# Patient Record
Sex: Female | Born: 1935 | Race: White | Hispanic: No | Smoking: Never smoker
Health system: Southern US, Community
[De-identification: ages and names within clinical notes are randomized; demographics above are authoritative.]

## PROBLEM LIST (undated history)

## (undated) DIAGNOSIS — C801 Malignant (primary) neoplasm, unspecified: Secondary | ICD-10-CM

## (undated) DIAGNOSIS — F329 Major depressive disorder, single episode, unspecified: Secondary | ICD-10-CM

## (undated) DIAGNOSIS — F0390 Unspecified dementia without behavioral disturbance: Secondary | ICD-10-CM

## (undated) DIAGNOSIS — F32A Depression, unspecified: Secondary | ICD-10-CM

## (undated) DIAGNOSIS — E119 Type 2 diabetes mellitus without complications: Secondary | ICD-10-CM

## (undated) DIAGNOSIS — F410 Panic disorder [episodic paroxysmal anxiety] without agoraphobia: Secondary | ICD-10-CM

## (undated) DIAGNOSIS — N289 Disorder of kidney and ureter, unspecified: Secondary | ICD-10-CM

## (undated) DIAGNOSIS — I10 Essential (primary) hypertension: Secondary | ICD-10-CM

## (undated) DIAGNOSIS — I4891 Unspecified atrial fibrillation: Secondary | ICD-10-CM

## (undated) HISTORY — PX: COLON RESECTION: SHX5231

---

## 2011-07-04 ENCOUNTER — Emergency Department: Payer: Self-pay | Admitting: Emergency Medicine

## 2011-07-04 LAB — CBC WITH DIFFERENTIAL/PLATELET
Basophil #: 0 10*3/uL (ref 0.0–0.1)
Basophil %: 0.6 %
Eosinophil #: 0.2 10*3/uL (ref 0.0–0.7)
HCT: 32.3 % — ABNORMAL LOW (ref 35.0–47.0)
HGB: 10.6 g/dL — ABNORMAL LOW (ref 12.0–16.0)
Lymphocyte #: 0.9 10*3/uL — ABNORMAL LOW (ref 1.0–3.6)
Lymphocyte %: 12.4 %
MCH: 27.2 pg (ref 26.0–34.0)
MCHC: 32.8 g/dL (ref 32.0–36.0)
MCV: 83 fL (ref 80–100)
Neutrophil #: 5.8 10*3/uL (ref 1.4–6.5)
Neutrophil %: 77.4 %
Platelet: 231 10*3/uL (ref 150–440)
RBC: 3.89 10*6/uL (ref 3.80–5.20)
RDW: 15.5 % — ABNORMAL HIGH (ref 11.5–14.5)

## 2011-07-04 LAB — URINALYSIS, COMPLETE
Bilirubin,UR: NEGATIVE
Blood: NEGATIVE
Glucose,UR: NEGATIVE mg/dL (ref 0–75)
Ketone: NEGATIVE
Nitrite: NEGATIVE
Ph: 7 (ref 4.5–8.0)
Protein: NEGATIVE
RBC,UR: NONE SEEN /HPF (ref 0–5)
Renal Epithelial: <1
Specific Gravity: 1.006 (ref 1.003–1.030)
Squamous Epithelial: 1
Transitional Epi: 1
WBC UR: 5 /HPF (ref 0–5)

## 2011-07-04 LAB — PROTIME-INR
INR: 0.9
Prothrombin Time: 12.2 secs (ref 11.5–14.7)

## 2011-07-04 LAB — BASIC METABOLIC PANEL
Anion Gap: 10 (ref 7–16)
Calcium, Total: 9.2 mg/dL (ref 8.5–10.1)
Co2: 29 mmol/L (ref 21–32)
EGFR (Non-African Amer.): 48 — ABNORMAL LOW
Sodium: 139 mmol/L (ref 136–145)

## 2011-09-11 ENCOUNTER — Emergency Department: Payer: Self-pay | Admitting: Emergency Medicine

## 2011-09-11 LAB — CBC
HCT: 32.6 % — ABNORMAL LOW (ref 35.0–47.0)
HGB: 11 g/dL — ABNORMAL LOW (ref 12.0–16.0)
RBC: 4.04 10*6/uL (ref 3.80–5.20)
RDW: 15.7 % — ABNORMAL HIGH (ref 11.5–14.5)
WBC: 7.9 10*3/uL (ref 3.6–11.0)

## 2011-09-11 LAB — URINALYSIS, COMPLETE
Blood: NEGATIVE
Glucose,UR: NEGATIVE mg/dL (ref 0–75)
Hyaline Cast: 1
Nitrite: POSITIVE
Ph: 7 (ref 4.5–8.0)
Protein: NEGATIVE
Specific Gravity: 1.01 (ref 1.003–1.030)
Squamous Epithelial: <1
WBC UR: 1 /HPF (ref 0–5)

## 2011-09-11 LAB — COMPREHENSIVE METABOLIC PANEL
Albumin: 3.4 g/dL (ref 3.4–5.0)
Alkaline Phosphatase: 94 U/L (ref 50–136)
BUN: 21 mg/dL — ABNORMAL HIGH (ref 7–18)
Bilirubin,Total: 0.5 mg/dL (ref 0.2–1.0)
Calcium, Total: 9.4 mg/dL (ref 8.5–10.1)
Creatinine: 1.09 mg/dL (ref 0.60–1.30)
EGFR (African American): 53 — ABNORMAL LOW
Glucose: 141 mg/dL — ABNORMAL HIGH (ref 65–99)
Potassium: 3.6 mmol/L (ref 3.5–5.1)
SGOT(AST): 32 U/L (ref 15–37)
SGPT (ALT): 21 U/L (ref 12–78)
Sodium: 132 mmol/L — ABNORMAL LOW (ref 136–145)
Total Protein: 7.7 g/dL (ref 6.4–8.2)

## 2012-05-04 ENCOUNTER — Emergency Department: Payer: Self-pay | Admitting: Emergency Medicine

## 2012-05-04 LAB — URINALYSIS, COMPLETE
Bilirubin,UR: NEGATIVE
Blood: NEGATIVE
Glucose,UR: 50 mg/dL (ref 0–75)
Ketone: NEGATIVE
Ph: 7 (ref 4.5–8.0)
Protein: NEGATIVE
RBC,UR: 1 /HPF (ref 0–5)
Specific Gravity: 1.015 (ref 1.003–1.030)
WBC UR: 3 /HPF (ref 0–5)

## 2012-05-04 LAB — CBC
HGB: 10.4 g/dL — ABNORMAL LOW (ref 12.0–16.0)
MCH: 25.6 pg — ABNORMAL LOW (ref 26.0–34.0)
MCHC: 32.4 g/dL (ref 32.0–36.0)
MCV: 79 fL — ABNORMAL LOW (ref 80–100)
Platelet: 246 10*3/uL (ref 150–440)
RBC: 4.05 10*6/uL (ref 3.80–5.20)
RDW: 18.1 % — ABNORMAL HIGH (ref 11.5–14.5)
WBC: 11.1 10*3/uL — ABNORMAL HIGH (ref 3.6–11.0)

## 2012-05-04 LAB — COMPREHENSIVE METABOLIC PANEL
Alkaline Phosphatase: 94 U/L (ref 50–136)
Anion Gap: 8 (ref 7–16)
Bilirubin,Total: 0.6 mg/dL (ref 0.2–1.0)
Chloride: 97 mmol/L — ABNORMAL LOW (ref 98–107)
EGFR (Non-African Amer.): 33 — ABNORMAL LOW
Potassium: 3.4 mmol/L — ABNORMAL LOW (ref 3.5–5.1)
SGOT(AST): 26 U/L (ref 15–37)
SGPT (ALT): 20 U/L (ref 12–78)
Total Protein: 7.6 g/dL (ref 6.4–8.2)

## 2012-05-27 ENCOUNTER — Emergency Department: Payer: Self-pay | Admitting: Emergency Medicine

## 2012-05-27 LAB — URINALYSIS, COMPLETE
Blood: NEGATIVE
Leukocyte Esterase: NEGATIVE
Nitrite: NEGATIVE
Ph: 7 (ref 4.5–8.0)
RBC,UR: 1 /HPF (ref 0–5)
Squamous Epithelial: <1
WBC UR: 1 /HPF (ref 0–5)

## 2012-05-27 LAB — COMPREHENSIVE METABOLIC PANEL
Alkaline Phosphatase: 101 U/L (ref 50–136)
BUN: 23 mg/dL — ABNORMAL HIGH (ref 7–18)
Bilirubin,Total: 0.5 mg/dL (ref 0.2–1.0)
Calcium, Total: 8.9 mg/dL (ref 8.5–10.1)
Chloride: 94 mmol/L — ABNORMAL LOW (ref 98–107)
Co2: 25 mmol/L (ref 21–32)
EGFR (African American): 49 — ABNORMAL LOW
Glucose: 152 mg/dL — ABNORMAL HIGH (ref 65–99)
Osmolality: 264 (ref 275–301)
Potassium: 3.5 mmol/L (ref 3.5–5.1)
SGOT(AST): 65 U/L — ABNORMAL HIGH (ref 15–37)
SGPT (ALT): 42 U/L (ref 12–78)
Total Protein: 7.3 g/dL (ref 6.4–8.2)

## 2012-05-27 LAB — CBC
HGB: 10.1 g/dL — ABNORMAL LOW (ref 12.0–16.0)
MCHC: 32.3 g/dL (ref 32.0–36.0)
MCV: 79 fL — ABNORMAL LOW (ref 80–100)
Platelet: 232 10*3/uL (ref 150–440)
RDW: 16.8 % — ABNORMAL HIGH (ref 11.5–14.5)

## 2012-05-27 LAB — TROPONIN I: Troponin-I: <0.02 ng/mL

## 2012-05-27 LAB — TSH: Thyroid Stimulating Horm: 1.51 u[IU]/mL

## 2012-06-12 ENCOUNTER — Inpatient Hospital Stay: Payer: Self-pay | Admitting: Internal Medicine

## 2012-06-12 LAB — CBC
HCT: 28.3 % — ABNORMAL LOW (ref 35.0–47.0)
HGB: 9.2 g/dL — ABNORMAL LOW (ref 12.0–16.0)
MCHC: 32.6 g/dL (ref 32.0–36.0)
MCV: 77 fL — ABNORMAL LOW (ref 80–100)
Platelet: 271 10*3/uL (ref 150–440)
RBC: 3.66 10*6/uL — ABNORMAL LOW (ref 3.80–5.20)
RDW: 16.4 % — ABNORMAL HIGH (ref 11.5–14.5)
WBC: 12.9 10*3/uL — ABNORMAL HIGH (ref 3.6–11.0)

## 2012-06-12 LAB — COMPREHENSIVE METABOLIC PANEL
Albumin: 3.1 g/dL — ABNORMAL LOW (ref 3.4–5.0)
Alkaline Phosphatase: 108 U/L (ref 50–136)
Calcium, Total: 9.1 mg/dL (ref 8.5–10.1)
Chloride: 98 mmol/L (ref 98–107)
Co2: 24 mmol/L (ref 21–32)
Creatinine: 1.21 mg/dL (ref 0.60–1.30)
EGFR (African American): 46 — ABNORMAL LOW
EGFR (Non-African Amer.): 40 — ABNORMAL LOW
Osmolality: 273 (ref 275–301)
SGOT(AST): 30 U/L (ref 15–37)
Sodium: 131 mmol/L — ABNORMAL LOW (ref 136–145)
Total Protein: 7.2 g/dL (ref 6.4–8.2)

## 2012-06-12 LAB — URINALYSIS, COMPLETE
Ketone: NEGATIVE
Ph: 7 (ref 4.5–8.0)
Protein: NEGATIVE
RBC,UR: 1 /HPF (ref 0–5)

## 2012-06-13 LAB — BASIC METABOLIC PANEL
Chloride: 106 mmol/L (ref 98–107)
Co2: 26 mmol/L (ref 21–32)
Creatinine: 0.99 mg/dL (ref 0.60–1.30)
EGFR (Non-African Amer.): 51 — ABNORMAL LOW
Osmolality: 277 (ref 275–301)
Potassium: 3 mmol/L — ABNORMAL LOW (ref 3.5–5.1)

## 2012-06-13 LAB — CBC WITH DIFFERENTIAL/PLATELET
Basophil #: 0.1 10*3/uL (ref 0.0–0.1)
Basophil %: 1.2 %
Lymphocyte %: 21.2 %
MCH: 26.1 pg (ref 26.0–34.0)
MCHC: 33.8 g/dL (ref 32.0–36.0)
Monocyte #: 1.1 x10 3/mm — ABNORMAL HIGH (ref 0.2–0.9)
Platelet: 215 10*3/uL (ref 150–440)
WBC: 7.6 10*3/uL (ref 3.6–11.0)

## 2012-06-14 LAB — POTASSIUM: Potassium: 2.9 mmol/L — ABNORMAL LOW (ref 3.5–5.1)

## 2012-06-14 LAB — URINE CULTURE

## 2012-06-15 LAB — CBC WITH DIFFERENTIAL/PLATELET
Basophil #: 0.1 10*3/uL (ref 0.0–0.1)
Basophil %: 1 %
Eosinophil #: 0.3 10*3/uL (ref 0.0–0.7)
Eosinophil %: 5.1 %
HCT: 25.5 % — ABNORMAL LOW (ref 35.0–47.0)
HGB: 8.5 g/dL — ABNORMAL LOW (ref 12.0–16.0)
Lymphocyte #: 1.8 10*3/uL (ref 1.0–3.6)
Lymphocyte %: 27.4 %
MCH: 25.7 pg — ABNORMAL LOW (ref 26.0–34.0)
MCHC: 33.4 g/dL (ref 32.0–36.0)
MCV: 77 fL — ABNORMAL LOW (ref 80–100)
Monocyte #: 0.8 x10 3/mm (ref 0.2–0.9)
Monocyte %: 11.9 %
Neutrophil %: 54.6 %
Platelet: 243 10*3/uL (ref 150–440)
RBC: 3.31 10*6/uL — ABNORMAL LOW (ref 3.80–5.20)
RDW: 16.5 % — ABNORMAL HIGH (ref 11.5–14.5)

## 2012-06-15 LAB — BASIC METABOLIC PANEL
Anion Gap: 6 — ABNORMAL LOW (ref 7–16)
BUN: 13 mg/dL (ref 7–18)
Co2: 24 mmol/L (ref 21–32)
Creatinine: 0.95 mg/dL (ref 0.60–1.30)
EGFR (Non-African Amer.): 53 — ABNORMAL LOW
Glucose: 154 mg/dL — ABNORMAL HIGH (ref 65–99)
Osmolality: 277 (ref 275–301)
Potassium: 3.7 mmol/L (ref 3.5–5.1)
Sodium: 137 mmol/L (ref 136–145)

## 2012-06-22 ENCOUNTER — Inpatient Hospital Stay: Payer: Self-pay | Admitting: Specialist

## 2012-06-22 LAB — COMPREHENSIVE METABOLIC PANEL
Albumin: 3.4 g/dL (ref 3.4–5.0)
Alkaline Phosphatase: 112 U/L (ref 50–136)
Anion Gap: 11 (ref 7–16)
Creatinine: 0.77 mg/dL (ref 0.60–1.30)
Osmolality: 233 (ref 275–301)
Potassium: 3 mmol/L — ABNORMAL LOW (ref 3.5–5.1)
SGOT(AST): 33 U/L (ref 15–37)
SGPT (ALT): 32 U/L (ref 12–78)
Sodium: 114 mmol/L — CL (ref 136–145)
Total Protein: 7.3 g/dL (ref 6.4–8.2)

## 2012-06-22 LAB — URINALYSIS, COMPLETE
Blood: NEGATIVE
Nitrite: NEGATIVE
Protein: NEGATIVE
RBC,UR: 2 /HPF (ref 0–5)
Squamous Epithelial: NONE SEEN
WBC UR: <1 /HPF (ref 0–5)

## 2012-06-22 LAB — CBC WITH DIFFERENTIAL/PLATELET
Basophil %: 0.3 %
Eosinophil #: 0.2 10*3/uL (ref 0.0–0.7)
Eosinophil %: 1.8 %
HCT: 29.2 % — ABNORMAL LOW (ref 35.0–47.0)
HGB: 9.7 g/dL — ABNORMAL LOW (ref 12.0–16.0)
Lymphocyte %: 11 %
MCH: 24.4 pg — ABNORMAL LOW (ref 26.0–34.0)
MCV: 73 fL — ABNORMAL LOW (ref 80–100)
Monocyte #: 1.7 x10 3/mm — ABNORMAL HIGH (ref 0.2–0.9)
Monocyte %: 12.3 %
Neutrophil #: 10.1 10*3/uL — ABNORMAL HIGH (ref 1.4–6.5)
RBC: 3.99 10*6/uL (ref 3.80–5.20)
RDW: 15.9 % — ABNORMAL HIGH (ref 11.5–14.5)
WBC: 13.6 10*3/uL — ABNORMAL HIGH (ref 3.6–11.0)

## 2012-06-22 LAB — BASIC METABOLIC PANEL
BUN: 12 mg/dL (ref 7–18)
Calcium, Total: 8 mg/dL — ABNORMAL LOW (ref 8.5–10.1)
Chloride: 86 mmol/L — ABNORMAL LOW (ref 98–107)
Co2: 20 mmol/L — ABNORMAL LOW (ref 21–32)
Creatinine: 0.63 mg/dL (ref 0.60–1.30)
EGFR (Non-African Amer.): >60
Glucose: 106 mg/dL — ABNORMAL HIGH (ref 65–99)
Osmolality: 235 (ref 275–301)
Potassium: 3.1 mmol/L — ABNORMAL LOW (ref 3.5–5.1)

## 2012-06-22 LAB — TROPONIN I: Troponin-I: <0.02 ng/mL

## 2012-06-22 LAB — SODIUM, URINE, RANDOM: Sodium, Urine Random: 120 mmol/L (ref 20–110)

## 2012-06-22 LAB — SODIUM
Sodium: 113 mmol/L — CL (ref 136–145)
Sodium: 122 mmol/L — ABNORMAL LOW (ref 136–145)

## 2012-06-22 LAB — CK TOTAL AND CKMB (NOT AT ARMC): CK-MB: 1.8 ng/mL (ref 0.5–3.6)

## 2012-06-22 LAB — PROTIME-INR: INR: 1

## 2012-06-22 LAB — OSMOLALITY, URINE: Osmolality: 414 mOsm/kg

## 2012-06-23 LAB — CBC WITH DIFFERENTIAL/PLATELET
Eosinophil #: 0.1 10*3/uL (ref 0.0–0.7)
Eosinophil %: 1.4 %
HGB: 8.6 g/dL — ABNORMAL LOW (ref 12.0–16.0)
MCH: 25.8 pg — ABNORMAL LOW (ref 26.0–34.0)
MCHC: 34.9 g/dL (ref 32.0–36.0)
MCV: 74 fL — ABNORMAL LOW (ref 80–100)
Neutrophil #: 4.8 10*3/uL (ref 1.4–6.5)
Neutrophil %: 65.8 %
RBC: 3.32 10*6/uL — ABNORMAL LOW (ref 3.80–5.20)
WBC: 7.3 10*3/uL (ref 3.6–11.0)

## 2012-06-23 LAB — LIPID PANEL
Cholesterol: 141 mg/dL (ref 0–200)
Ldl Cholesterol, Calc: 85 mg/dL (ref 0–100)
Triglycerides: 109 mg/dL (ref 0–200)
VLDL Cholesterol, Calc: 22 mg/dL (ref 5–40)

## 2012-06-23 LAB — BASIC METABOLIC PANEL
Anion Gap: 7 (ref 7–16)
Calcium, Total: 8.6 mg/dL (ref 8.5–10.1)
Chloride: 101 mmol/L (ref 98–107)
Co2: 23 mmol/L (ref 21–32)
Creatinine: 0.79 mg/dL (ref 0.60–1.30)
EGFR (African American): >60
EGFR (Non-African Amer.): >60
Glucose: 72 mg/dL (ref 65–99)
Sodium: 131 mmol/L — ABNORMAL LOW (ref 136–145)

## 2012-06-23 LAB — OSMOLALITY, URINE: Osmolality: 111 mOsm/kg

## 2012-06-23 LAB — SODIUM, URINE, RANDOM: Sodium, Urine Random: 33 mmol/L (ref 20–110)

## 2012-06-24 LAB — RENAL FUNCTION PANEL
Albumin: 2.7 g/dL — ABNORMAL LOW (ref 3.4–5.0)
Anion Gap: 8 (ref 7–16)
BUN: 12 mg/dL (ref 7–18)
Calcium, Total: 8.7 mg/dL (ref 8.5–10.1)
Chloride: 104 mmol/L (ref 98–107)
Creatinine: 1.06 mg/dL (ref 0.60–1.30)
EGFR (African American): 54 — ABNORMAL LOW
Osmolality: 271 (ref 275–301)
Phosphorus: 2.2 mg/dL — ABNORMAL LOW (ref 2.5–4.9)
Potassium: 3.4 mmol/L — ABNORMAL LOW (ref 3.5–5.1)

## 2012-06-28 LAB — CULTURE, BLOOD (SINGLE)

## 2012-07-16 ENCOUNTER — Ambulatory Visit: Payer: Self-pay | Admitting: Gastroenterology

## 2012-07-17 LAB — PATHOLOGY REPORT

## 2012-07-19 ENCOUNTER — Ambulatory Visit: Payer: Self-pay | Admitting: Oncology

## 2012-07-23 ENCOUNTER — Ambulatory Visit: Payer: Self-pay | Admitting: Oncology

## 2012-07-31 ENCOUNTER — Ambulatory Visit: Payer: Self-pay | Admitting: Oncology

## 2012-08-02 ENCOUNTER — Ambulatory Visit: Payer: Self-pay | Admitting: Surgery

## 2012-08-02 LAB — BASIC METABOLIC PANEL
Anion Gap: 9 (ref 7–16)
Calcium, Total: 9.1 mg/dL (ref 8.5–10.1)
Co2: 26 mmol/L (ref 21–32)
EGFR (African American): 57 — ABNORMAL LOW
EGFR (Non-African Amer.): 49 — ABNORMAL LOW
Glucose: 146 mg/dL — ABNORMAL HIGH (ref 65–99)
Potassium: 4.3 mmol/L (ref 3.5–5.1)
Sodium: 137 mmol/L (ref 136–145)

## 2012-08-02 LAB — CBC WITH DIFFERENTIAL/PLATELET
Basophil #: 0.1 10*3/uL (ref 0.0–0.1)
Eosinophil #: 0.6 10*3/uL (ref 0.0–0.7)
Eosinophil %: 7.2 %
HCT: 31 % — ABNORMAL LOW (ref 35.0–47.0)
HGB: 10.2 g/dL — ABNORMAL LOW (ref 12.0–16.0)
MCHC: 32.9 g/dL (ref 32.0–36.0)
Neutrophil %: 59.3 %
Platelet: 216 10*3/uL (ref 150–440)
RDW: 18.2 % — ABNORMAL HIGH (ref 11.5–14.5)
WBC: 8 10*3/uL (ref 3.6–11.0)

## 2012-08-08 ENCOUNTER — Inpatient Hospital Stay: Payer: Self-pay | Admitting: Surgery

## 2012-08-09 LAB — CBC WITH DIFFERENTIAL/PLATELET
Basophil #: 0 10*3/uL (ref 0.0–0.1)
Eosinophil #: 0 10*3/uL (ref 0.0–0.7)
HCT: 28.8 % — ABNORMAL LOW (ref 35.0–47.0)
Lymphocyte %: 2.8 %
MCH: 25.6 pg — ABNORMAL LOW (ref 26.0–34.0)
MCHC: 32.9 g/dL (ref 32.0–36.0)
MCV: 78 fL — ABNORMAL LOW (ref 80–100)
Neutrophil %: 91.2 %
RBC: 3.71 10*6/uL — ABNORMAL LOW (ref 3.80–5.20)
RDW: 17.8 % — ABNORMAL HIGH (ref 11.5–14.5)
WBC: 13.9 10*3/uL — ABNORMAL HIGH (ref 3.6–11.0)

## 2012-08-09 LAB — COMPREHENSIVE METABOLIC PANEL
Albumin: 2.6 g/dL — ABNORMAL LOW (ref 3.4–5.0)
Alkaline Phosphatase: 92 U/L (ref 50–136)
Anion Gap: 12 (ref 7–16)
Bilirubin,Total: 0.6 mg/dL (ref 0.2–1.0)
Calcium, Total: 8.5 mg/dL (ref 8.5–10.1)
Co2: 23 mmol/L (ref 21–32)
EGFR (African American): 50 — ABNORMAL LOW
EGFR (Non-African Amer.): 43 — ABNORMAL LOW
Glucose: 232 mg/dL — ABNORMAL HIGH (ref 65–99)
Osmolality: 280 (ref 275–301)
SGOT(AST): 19 U/L (ref 15–37)
Sodium: 137 mmol/L (ref 136–145)
Total Protein: 5.9 g/dL — ABNORMAL LOW (ref 6.4–8.2)

## 2012-08-10 LAB — URINALYSIS, COMPLETE
Nitrite: NEGATIVE
Ph: 7 (ref 4.5–8.0)
Protein: NEGATIVE
Specific Gravity: 1.009 (ref 1.003–1.030)
WBC UR: 1 /HPF (ref 0–5)

## 2012-08-11 LAB — BASIC METABOLIC PANEL
Anion Gap: 6 — ABNORMAL LOW (ref 7–16)
Calcium, Total: 8.7 mg/dL (ref 8.5–10.1)
Chloride: 104 mmol/L (ref 98–107)
Creatinine: 0.9 mg/dL (ref 0.60–1.30)
EGFR (African American): >60
EGFR (Non-African Amer.): 57 — ABNORMAL LOW
Osmolality: 275 (ref 275–301)

## 2012-08-11 LAB — CBC WITH DIFFERENTIAL/PLATELET
Eosinophil #: 0.4 10*3/uL (ref 0.0–0.7)
Eosinophil %: 3.9 %
HGB: 9.2 g/dL — ABNORMAL LOW (ref 12.0–16.0)
Lymphocyte #: 1.3 10*3/uL (ref 1.0–3.6)
Lymphocyte %: 11.6 %
MCH: 25.8 pg — ABNORMAL LOW (ref 26.0–34.0)
MCHC: 33.1 g/dL (ref 32.0–36.0)
MCV: 78 fL — ABNORMAL LOW (ref 80–100)
Monocyte #: 0.8 x10 3/mm (ref 0.2–0.9)
Neutrophil #: 8.3 10*3/uL — ABNORMAL HIGH (ref 1.4–6.5)
Platelet: 208 10*3/uL (ref 150–440)

## 2012-08-12 LAB — CBC WITH DIFFERENTIAL/PLATELET
Basophil #: 0.1 10*3/uL (ref 0.0–0.1)
Basophil %: 0.6 %
Eosinophil #: 0.2 10*3/uL (ref 0.0–0.7)
Eosinophil %: 1.8 %
HGB: 9.5 g/dL — ABNORMAL LOW (ref 12.0–16.0)
Lymphocyte %: 7.9 %
MCH: 25.5 pg — ABNORMAL LOW (ref 26.0–34.0)
MCHC: 32.7 g/dL (ref 32.0–36.0)
Monocyte #: 1 x10 3/mm — ABNORMAL HIGH (ref 0.2–0.9)
Monocyte %: 11.3 %
Neutrophil #: 7.3 10*3/uL — ABNORMAL HIGH (ref 1.4–6.5)
Platelet: 275 10*3/uL (ref 150–440)
RBC: 3.74 10*6/uL — ABNORMAL LOW (ref 3.80–5.20)
RDW: 17.5 % — ABNORMAL HIGH (ref 11.5–14.5)

## 2012-08-12 LAB — BASIC METABOLIC PANEL
Anion Gap: 6 — ABNORMAL LOW (ref 7–16)
BUN: 9 mg/dL (ref 7–18)
Chloride: 101 mmol/L (ref 98–107)
Co2: 27 mmol/L (ref 21–32)
EGFR (African American): 54 — ABNORMAL LOW
Glucose: 227 mg/dL — ABNORMAL HIGH (ref 65–99)
Osmolality: 274 (ref 275–301)
Potassium: 3.2 mmol/L — ABNORMAL LOW (ref 3.5–5.1)
Sodium: 134 mmol/L — ABNORMAL LOW (ref 136–145)

## 2012-08-12 LAB — MAGNESIUM: Magnesium: 1.1 mg/dL — ABNORMAL LOW

## 2012-08-13 LAB — COMPREHENSIVE METABOLIC PANEL
Albumin: 1.9 g/dL — ABNORMAL LOW (ref 3.4–5.0)
Alkaline Phosphatase: 89 U/L (ref 50–136)
BUN: 8 mg/dL (ref 7–18)
Bilirubin,Total: 0.4 mg/dL (ref 0.2–1.0)
Calcium, Total: 8.1 mg/dL — ABNORMAL LOW (ref 8.5–10.1)
Chloride: 107 mmol/L (ref 98–107)
Creatinine: 0.94 mg/dL (ref 0.60–1.30)
EGFR (African American): >60
EGFR (Non-African Amer.): 54 — ABNORMAL LOW
Glucose: 98 mg/dL (ref 65–99)
Osmolality: 276 (ref 275–301)
SGPT (ALT): 9 U/L — ABNORMAL LOW (ref 12–78)
Sodium: 139 mmol/L (ref 136–145)
Total Protein: 5.2 g/dL — ABNORMAL LOW (ref 6.4–8.2)

## 2012-08-13 LAB — TROPONIN I: Troponin-I: 0.36 ng/mL — ABNORMAL HIGH

## 2012-08-13 LAB — PATHOLOGY REPORT

## 2012-08-14 LAB — BASIC METABOLIC PANEL
Anion Gap: 4 — ABNORMAL LOW (ref 7–16)
BUN: 6 mg/dL — ABNORMAL LOW (ref 7–18)
Calcium, Total: 8.6 mg/dL (ref 8.5–10.1)
Chloride: 108 mmol/L — ABNORMAL HIGH (ref 98–107)
Co2: 28 mmol/L (ref 21–32)
Creatinine: 0.91 mg/dL (ref 0.60–1.30)
EGFR (Non-African Amer.): 56 — ABNORMAL LOW
Glucose: 108 mg/dL — ABNORMAL HIGH (ref 65–99)

## 2012-08-15 LAB — BASIC METABOLIC PANEL
Anion Gap: 5 — ABNORMAL LOW (ref 7–16)
BUN: 8 mg/dL (ref 7–18)
Calcium, Total: 8.6 mg/dL (ref 8.5–10.1)
Chloride: 107 mmol/L (ref 98–107)
Co2: 26 mmol/L (ref 21–32)
Creatinine: 0.85 mg/dL (ref 0.60–1.30)
EGFR (Non-African Amer.): >60
Glucose: 106 mg/dL — ABNORMAL HIGH (ref 65–99)
Osmolality: 274 (ref 275–301)
Potassium: 4 mmol/L (ref 3.5–5.1)
Sodium: 138 mmol/L (ref 136–145)

## 2012-08-15 LAB — CBC WITH DIFFERENTIAL/PLATELET
Basophil #: 0.1 10*3/uL (ref 0.0–0.1)
HCT: 27.9 % — ABNORMAL LOW (ref 35.0–47.0)
Lymphocyte #: 1.4 10*3/uL (ref 1.0–3.6)
Lymphocyte %: 18.2 %
MCH: 25.2 pg — ABNORMAL LOW (ref 26.0–34.0)
Monocyte #: 0.9 x10 3/mm (ref 0.2–0.9)
Monocyte %: 11.5 %
Neutrophil #: 4.9 10*3/uL (ref 1.4–6.5)
Platelet: 315 10*3/uL (ref 150–440)

## 2012-08-15 LAB — COMPREHENSIVE METABOLIC PANEL
Albumin: 2.3 g/dL — ABNORMAL LOW (ref 3.4–5.0)
Alkaline Phosphatase: 94 U/L (ref 50–136)
Bilirubin,Total: 0.3 mg/dL (ref 0.2–1.0)
SGOT(AST): 19 U/L (ref 15–37)

## 2012-08-21 ENCOUNTER — Ambulatory Visit: Payer: Self-pay | Admitting: Oncology

## 2012-08-22 LAB — CEA: CEA: 0.6 ng/mL (ref 0.0–4.7)

## 2012-08-31 ENCOUNTER — Ambulatory Visit: Payer: Self-pay | Admitting: Oncology

## 2012-09-05 ENCOUNTER — Other Ambulatory Visit: Payer: Self-pay | Admitting: Surgery

## 2012-09-05 LAB — CBC WITH DIFFERENTIAL/PLATELET
Basophil #: 0.1 10*3/uL (ref 0.0–0.1)
Basophil %: 1.3 %
HGB: 10.7 g/dL — ABNORMAL LOW (ref 12.0–16.0)
Lymphocyte #: 1.6 10*3/uL (ref 1.0–3.6)
Lymphocyte %: 17.9 %
MCH: 25.8 pg — ABNORMAL LOW (ref 26.0–34.0)
MCV: 77 fL — ABNORMAL LOW (ref 80–100)
Monocyte #: 1 x10 3/mm — ABNORMAL HIGH (ref 0.2–0.9)
Monocyte %: 11 %
Neutrophil #: 5.8 10*3/uL (ref 1.4–6.5)
Platelet: 268 10*3/uL (ref 150–440)
RDW: 17.8 % — ABNORMAL HIGH (ref 11.5–14.5)
WBC: 9.2 10*3/uL (ref 3.6–11.0)

## 2012-09-05 LAB — COMPREHENSIVE METABOLIC PANEL
Alkaline Phosphatase: 94 U/L (ref 50–136)
Anion Gap: 4 — ABNORMAL LOW (ref 7–16)
Bilirubin,Total: 0.2 mg/dL (ref 0.2–1.0)
Chloride: 99 mmol/L (ref 98–107)
Co2: 27 mmol/L (ref 21–32)
EGFR (African American): >60
EGFR (Non-African Amer.): 59 — ABNORMAL LOW
Glucose: 187 mg/dL — ABNORMAL HIGH (ref 65–99)
Potassium: 5.1 mmol/L (ref 3.5–5.1)
SGPT (ALT): 25 U/L (ref 12–78)
Sodium: 130 mmol/L — ABNORMAL LOW (ref 136–145)

## 2012-09-15 ENCOUNTER — Emergency Department: Payer: Self-pay | Admitting: Emergency Medicine

## 2012-09-16 LAB — CBC WITH DIFFERENTIAL/PLATELET
Basophil #: 0.1 10*3/uL (ref 0.0–0.1)
Eosinophil #: 0.5 10*3/uL (ref 0.0–0.7)
Eosinophil %: 6 %
HCT: 34.3 % — ABNORMAL LOW (ref 35.0–47.0)
Lymphocyte #: 2.3 10*3/uL (ref 1.0–3.6)
Lymphocyte %: 25.1 %
MCHC: 33.5 g/dL (ref 32.0–36.0)
Monocyte #: 0.9 x10 3/mm (ref 0.2–0.9)
Monocyte %: 10.3 %
Platelet: 204 10*3/uL (ref 150–440)
RBC: 4.41 10*6/uL (ref 3.80–5.20)
RDW: 18.5 % — ABNORMAL HIGH (ref 11.5–14.5)
WBC: 9.1 10*3/uL (ref 3.6–11.0)

## 2012-09-16 LAB — URINALYSIS, COMPLETE
Blood: NEGATIVE
Glucose,UR: NEGATIVE mg/dL (ref 0–75)
Leukocyte Esterase: NEGATIVE
Ph: 7 (ref 4.5–8.0)
RBC,UR: <1 /HPF (ref 0–5)
Specific Gravity: 1.002 (ref 1.003–1.030)
WBC UR: <1 /HPF (ref 0–5)

## 2012-09-16 LAB — BASIC METABOLIC PANEL
BUN: 9 mg/dL (ref 7–18)
Calcium, Total: 9.6 mg/dL (ref 8.5–10.1)
Co2: 26 mmol/L (ref 21–32)
Creatinine: 0.86 mg/dL (ref 0.60–1.30)
EGFR (African American): >60
EGFR (Non-African Amer.): >60
Glucose: 88 mg/dL (ref 65–99)
Osmolality: 259 (ref 275–301)

## 2012-09-17 ENCOUNTER — Emergency Department: Payer: Self-pay | Admitting: Emergency Medicine

## 2012-09-17 LAB — BASIC METABOLIC PANEL
BUN: 10 mg/dL (ref 7–18)
Calcium, Total: 9.6 mg/dL (ref 8.5–10.1)
Chloride: 98 mmol/L (ref 98–107)
Co2: 23 mmol/L (ref 21–32)
Creatinine: 0.91 mg/dL (ref 0.60–1.30)
EGFR (African American): >60
EGFR (Non-African Amer.): 56 — ABNORMAL LOW
Glucose: 130 mg/dL — ABNORMAL HIGH (ref 65–99)
Potassium: 3.7 mmol/L (ref 3.5–5.1)
Sodium: 131 mmol/L — ABNORMAL LOW (ref 136–145)

## 2012-09-17 LAB — TROPONIN I: Troponin-I: <0.02 ng/mL

## 2012-09-17 LAB — CBC
HCT: 31.1 % — ABNORMAL LOW (ref 35.0–47.0)
MCH: 25.9 pg — ABNORMAL LOW (ref 26.0–34.0)
RBC: 4.02 10*6/uL (ref 3.80–5.20)
RDW: 18.6 % — ABNORMAL HIGH (ref 11.5–14.5)

## 2012-09-17 LAB — CK TOTAL AND CKMB (NOT AT ARMC): CK-MB: 1.6 ng/mL (ref 0.5–3.6)

## 2012-09-18 LAB — URINALYSIS, COMPLETE
Bacteria: NONE SEEN
Bilirubin,UR: NEGATIVE
Blood: NEGATIVE
Glucose,UR: NEGATIVE mg/dL (ref 0–75)
Leukocyte Esterase: NEGATIVE
Specific Gravity: 1.003 (ref 1.003–1.030)

## 2012-12-07 ENCOUNTER — Emergency Department: Payer: Self-pay | Admitting: Emergency Medicine

## 2012-12-24 ENCOUNTER — Ambulatory Visit: Payer: Self-pay | Admitting: Oncology

## 2012-12-24 LAB — CBC CANCER CENTER
Basophil #: 0.1 x10 3/mm (ref 0.0–0.1)
Basophil %: 1.2 %
Eosinophil #: 0.4 x10 3/mm (ref 0.0–0.7)
Eosinophil %: 5 %
HCT: 37.6 % (ref 35.0–47.0)
Lymphocyte %: 24 %
MCH: 28.8 pg (ref 26.0–34.0)
MCHC: 33 g/dL (ref 32.0–36.0)
MCV: 87 fL (ref 80–100)
Neutrophil #: 4.6 x10 3/mm (ref 1.4–6.5)
Neutrophil %: 60.1 %

## 2012-12-31 ENCOUNTER — Ambulatory Visit: Payer: Self-pay | Admitting: Oncology

## 2013-01-14 LAB — CBC
HGB: 12.6 g/dL (ref 12.0–16.0)
MCHC: 32.8 g/dL (ref 32.0–36.0)
MCV: 88 fL (ref 80–100)
Platelet: 178 10*3/uL (ref 150–440)
WBC: 8.1 10*3/uL (ref 3.6–11.0)

## 2013-01-14 LAB — COMPREHENSIVE METABOLIC PANEL
Anion Gap: 4 — ABNORMAL LOW (ref 7–16)
BUN: 28 mg/dL — ABNORMAL HIGH (ref 7–18)
Calcium, Total: 9.9 mg/dL (ref 8.5–10.1)
Creatinine: 1.19 mg/dL (ref 0.60–1.30)
Glucose: 110 mg/dL — ABNORMAL HIGH (ref 65–99)
Potassium: 4.9 mmol/L (ref 3.5–5.1)
SGOT(AST): 36 U/L (ref 15–37)
SGPT (ALT): 29 U/L (ref 12–78)
Sodium: 135 mmol/L — ABNORMAL LOW (ref 136–145)
Total Protein: 7.5 g/dL (ref 6.4–8.2)

## 2013-01-14 LAB — TROPONIN I: Troponin-I: 0.04 ng/mL

## 2013-01-15 ENCOUNTER — Inpatient Hospital Stay: Payer: Self-pay | Admitting: Internal Medicine

## 2013-01-15 LAB — URINALYSIS, COMPLETE
Bacteria: NONE SEEN
Bilirubin,UR: NEGATIVE
Glucose,UR: NEGATIVE mg/dL (ref 0–75)
Ketone: NEGATIVE
Leukocyte Esterase: NEGATIVE
Protein: NEGATIVE
RBC,UR: 15 /HPF (ref 0–5)
Specific Gravity: 1.008 (ref 1.003–1.030)
Squamous Epithelial: NONE SEEN
WBC UR: 1 /HPF (ref 0–5)

## 2013-01-15 LAB — CBC WITH DIFFERENTIAL/PLATELET
Eosinophil #: 0.4 10*3/uL (ref 0.0–0.7)
Eosinophil %: 4.8 %
HGB: 12.7 g/dL (ref 12.0–16.0)
Lymphocyte %: 22.2 %
MCV: 87 fL (ref 80–100)
Monocyte %: 9.5 %
Neutrophil #: 5.1 10*3/uL (ref 1.4–6.5)
Neutrophil %: 62.6 %
Platelet: 175 10*3/uL (ref 150–440)
RDW: 15.9 % — ABNORMAL HIGH (ref 11.5–14.5)
WBC: 8.1 10*3/uL (ref 3.6–11.0)

## 2013-01-15 LAB — BASIC METABOLIC PANEL
BUN: 24 mg/dL — ABNORMAL HIGH (ref 7–18)
Calcium, Total: 10 mg/dL (ref 8.5–10.1)
Chloride: 105 mmol/L (ref 98–107)
Co2: 26 mmol/L (ref 21–32)
Creatinine: 1.06 mg/dL (ref 0.60–1.30)
Potassium: 4 mmol/L (ref 3.5–5.1)
Sodium: 137 mmol/L (ref 136–145)

## 2013-01-16 LAB — BASIC METABOLIC PANEL
BUN: 26 mg/dL — ABNORMAL HIGH (ref 7–18)
Calcium, Total: 9.5 mg/dL (ref 8.5–10.1)
Co2: 24 mmol/L (ref 21–32)
EGFR (African American): 53 — ABNORMAL LOW
Glucose: 92 mg/dL (ref 65–99)
Osmolality: 284 (ref 275–301)
Potassium: 4 mmol/L (ref 3.5–5.1)
Sodium: 140 mmol/L (ref 136–145)

## 2013-01-17 LAB — CBC WITH DIFFERENTIAL/PLATELET
Basophil #: 0.1 10*3/uL (ref 0.0–0.1)
Basophil %: 1 %
Eosinophil #: 0.4 10*3/uL (ref 0.0–0.7)
Eosinophil %: 5.2 %
HGB: 12.3 g/dL (ref 12.0–16.0)
MCH: 29 pg (ref 26.0–34.0)
MCHC: 33.3 g/dL (ref 32.0–36.0)
MCV: 87 fL (ref 80–100)
Monocyte #: 0.9 x10 3/mm (ref 0.2–0.9)
Neutrophil #: 4.4 10*3/uL (ref 1.4–6.5)
Neutrophil %: 57.6 %
Platelet: 171 10*3/uL (ref 150–440)
RDW: 15.6 % — ABNORMAL HIGH (ref 11.5–14.5)
WBC: 7.6 10*3/uL (ref 3.6–11.0)

## 2013-01-17 LAB — BASIC METABOLIC PANEL
Anion Gap: 5 — ABNORMAL LOW (ref 7–16)
BUN: 24 mg/dL — ABNORMAL HIGH (ref 7–18)
Creatinine: 1.04 mg/dL (ref 0.60–1.30)
EGFR (African American): 56 — ABNORMAL LOW
EGFR (Non-African Amer.): 48 — ABNORMAL LOW
Osmolality: 279 (ref 275–301)
Potassium: 3.8 mmol/L (ref 3.5–5.1)
Sodium: 138 mmol/L (ref 136–145)

## 2013-01-18 ENCOUNTER — Ambulatory Visit: Payer: Self-pay | Admitting: Oncology

## 2013-05-30 IMAGING — CT CT ABD-PELV W/O CM
1 of 2 series · 15 of 32 positions shown, 19 images · non-contrast
Comparison: none

REASON FOR EXAM: (1) LLQ PAIN FEVER; (2) LLQ PAIN FEVER
COMMENTS:

PROCEDURE:     CT  - CT ABDOMEN AND PELVIS W[DATE]  [DATE]
RESULT:
TECHNIQUE: Helical noncontrasted 3 mm sections were obtained from the lung
bases through the pubic symphysis.

[Series 2: 3mm soft tissue · axial · 0.66mm/px · z∈[-468,-32]mm · 15 of 161 slices shown, 19 images]
[im 8/161  soft-tissue]
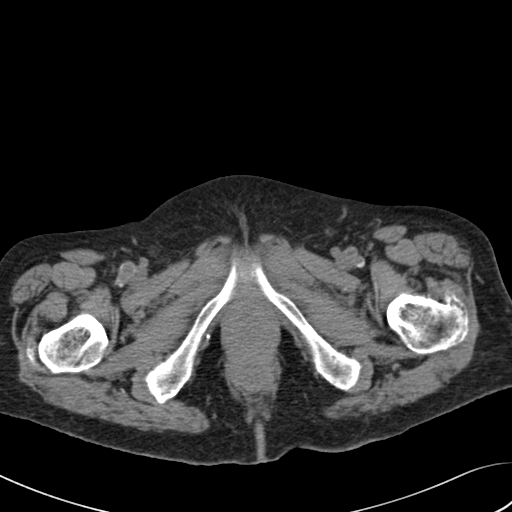
[im 8/161  bone]
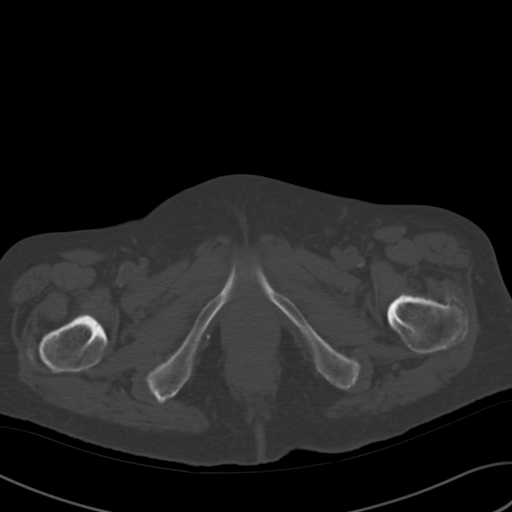
[im 22/161  soft-tissue]
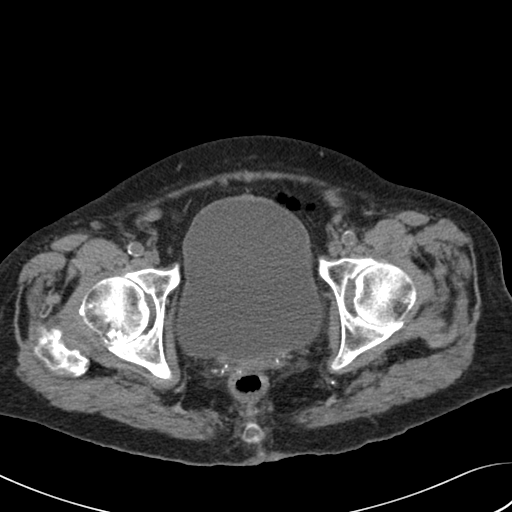
[im 37/161  soft-tissue]
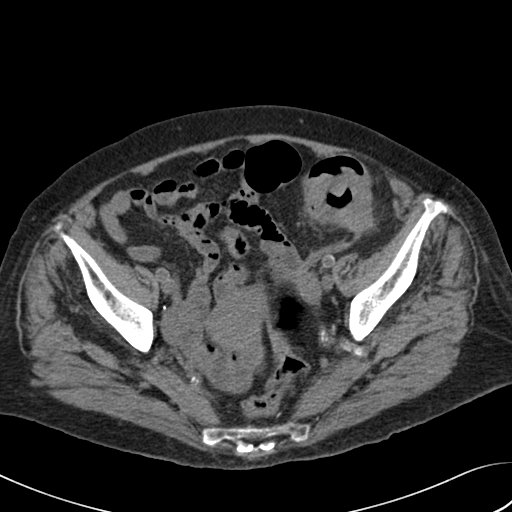
[im 44/161  soft-tissue]
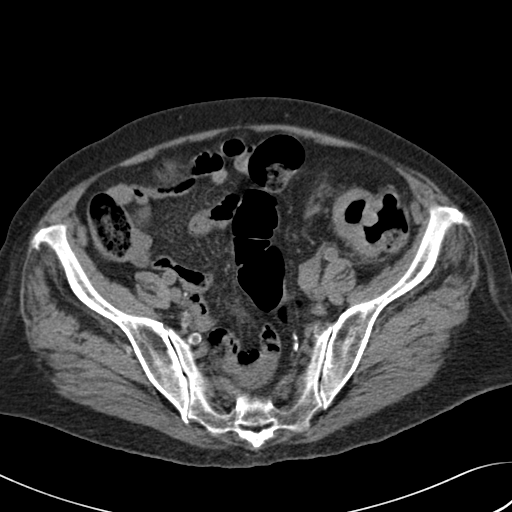
[im 59/161  soft-tissue]
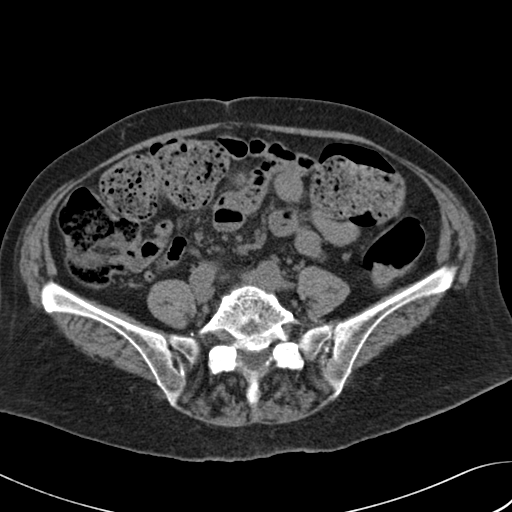
[im 66/161  soft-tissue]
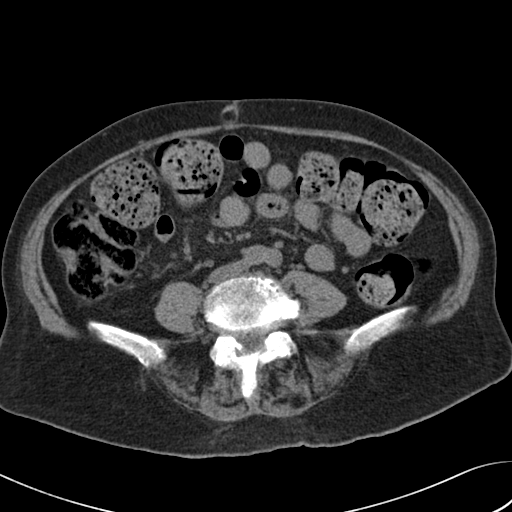
[im 81/161  soft-tissue]
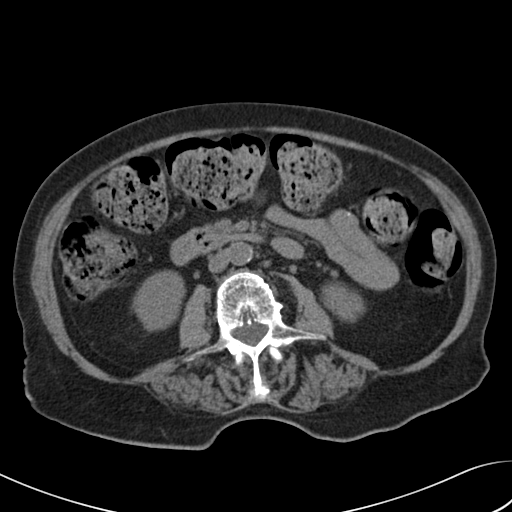
[im 95/161  soft-tissue]
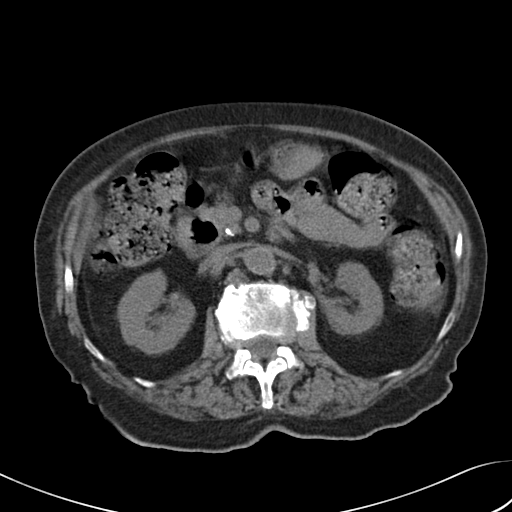
[im 102/161  soft-tissue]
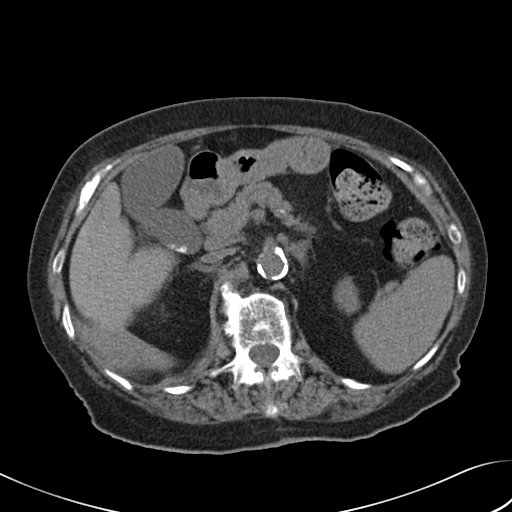
[im 102/161  bone]
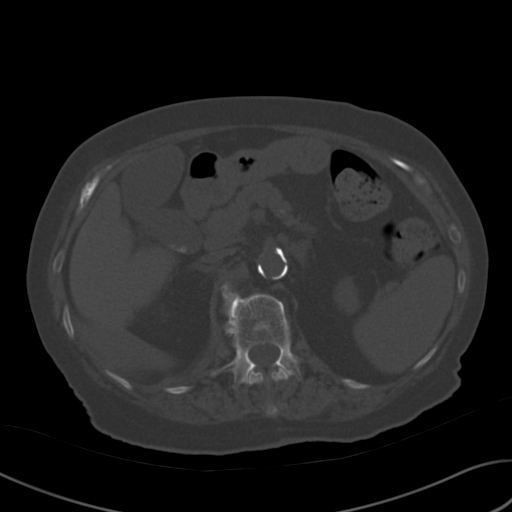
[im 117/161  soft-tissue]
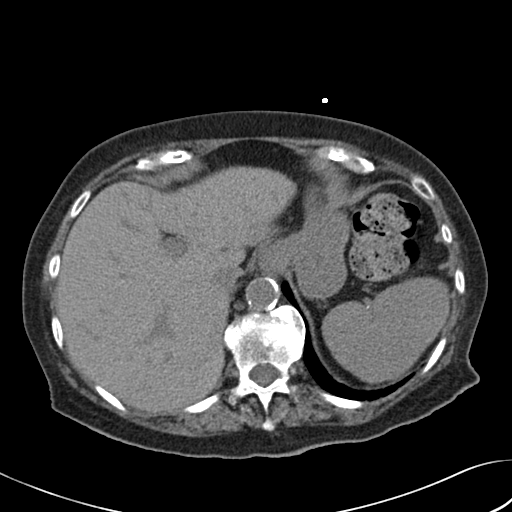
[im 124/161  soft-tissue]
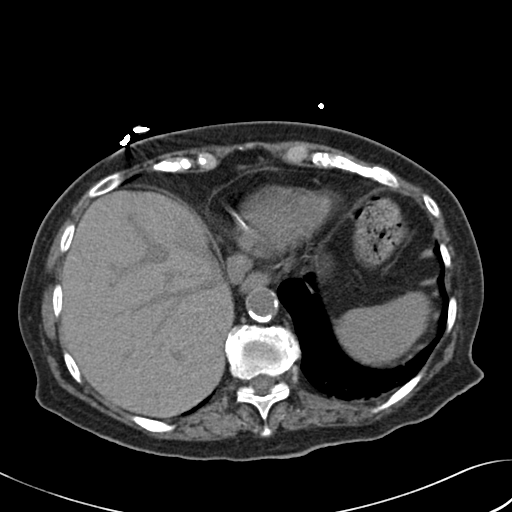
[im 131/161  lung]
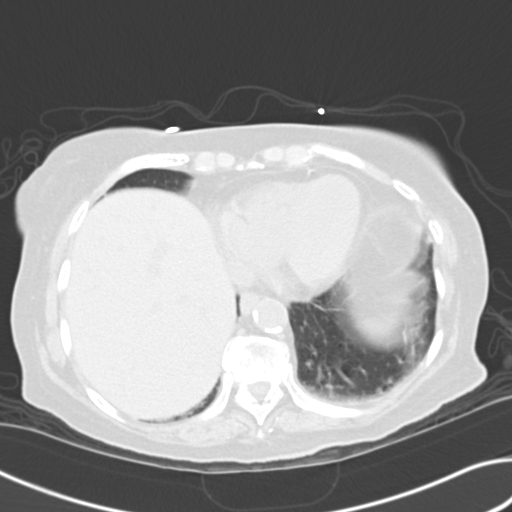
[im 139/161  soft-tissue]
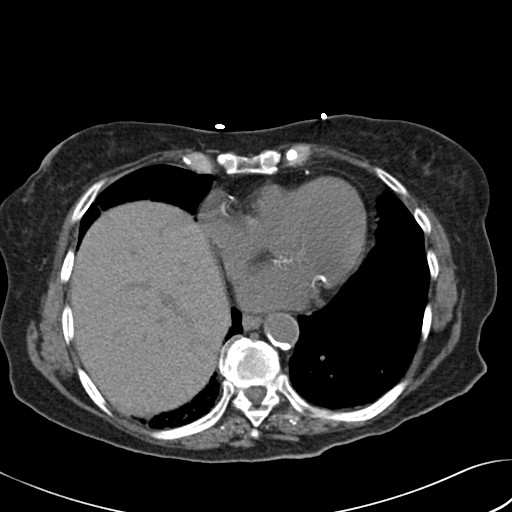
[im 139/161  lung]
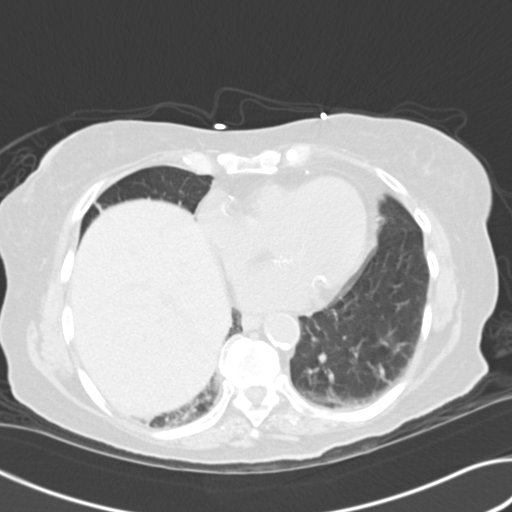
[im 146/161  lung]
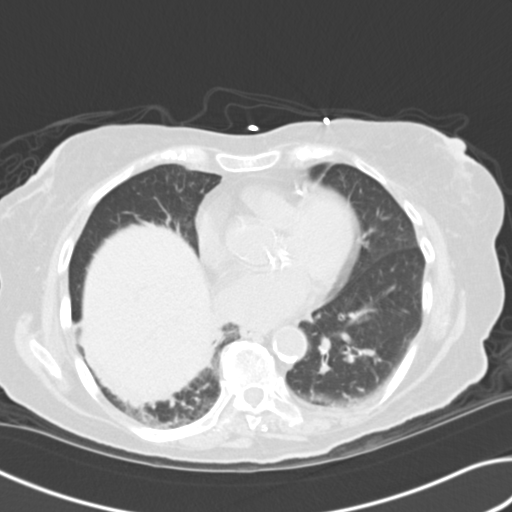
[im 153/161  soft-tissue]
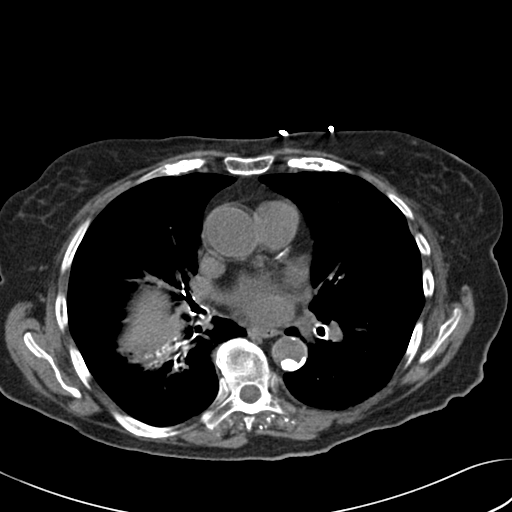
[im 153/161  lung]
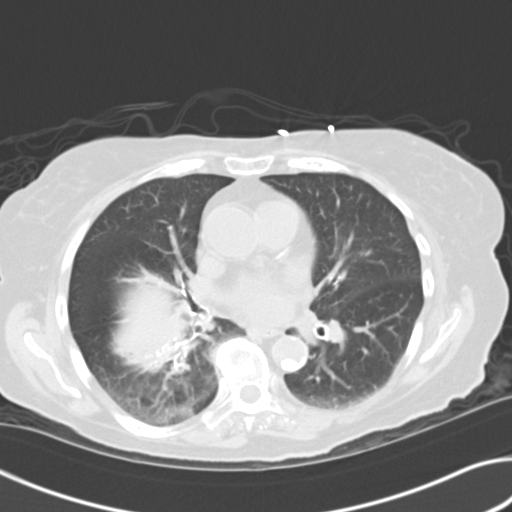

[15 of 32 positions shown; findings below may reference images not displayed]

FINDINGS: Hypoventilation is identified within the lung bases. Multichamber
cardiac enlargement is identified.

Noncontrast evaluation of the liver, spleen, adrenals and pancreas are
unremarkable. There is no evidence of hydronephrosis, hydroureter,
nephrolithiasis, nor ureterolithiasis. There is no evidence of an abdominal
aortic aneurysm. Course mural calcifications are identified within the aorta
and mesenteric vessels. There are calcifications identified within the
aorta.

A large amount of stool is appreciated within the colon.

Within the distal descending colon there is an area of bowel wall thickening
and stranding in the surrounding mesenteric fat. There is no evidence of
associated free fluid nor loculated fluid collections. Differential
considerations are an area of colitis though a mass cannot be excluded.
Surveillance evaluation is recommended status post appropriate therapeutic
regimen. The appendix is identified and is unremarkable. There is no
evidence of an abdominal aortic aneurysm.
IMPRESSION: 1. Focal thickening of the distal descending colon at the junction of the
sigmoid colon. There are adjacent inflammatory changes in the surrounding
mesenteric fat. Differential considerations are infectious or inflammatory
etiologies. A mass within this area cannot be excluded. There is no evidence
to suggest fistulization within the bladder. This finding is proximal to the
urinary bladder.
2. Urinary bladder is distended which may represent a component of bladder
outlet obstruction, correlation recommended.
3. Cholelithiasis.
4. There is no evidence of obstructive uropathy.
5. Dr. Tiger of the Emergency Department was informed of these findings via
a preliminary faxed report.

## 2013-05-30 IMAGING — CR DG CHEST 1V PORT
1 series · 1 of 1 positions shown · non-contrast
Comparison: none

REASON FOR EXAM: hypoxia fever
COMMENTS:

[ap]
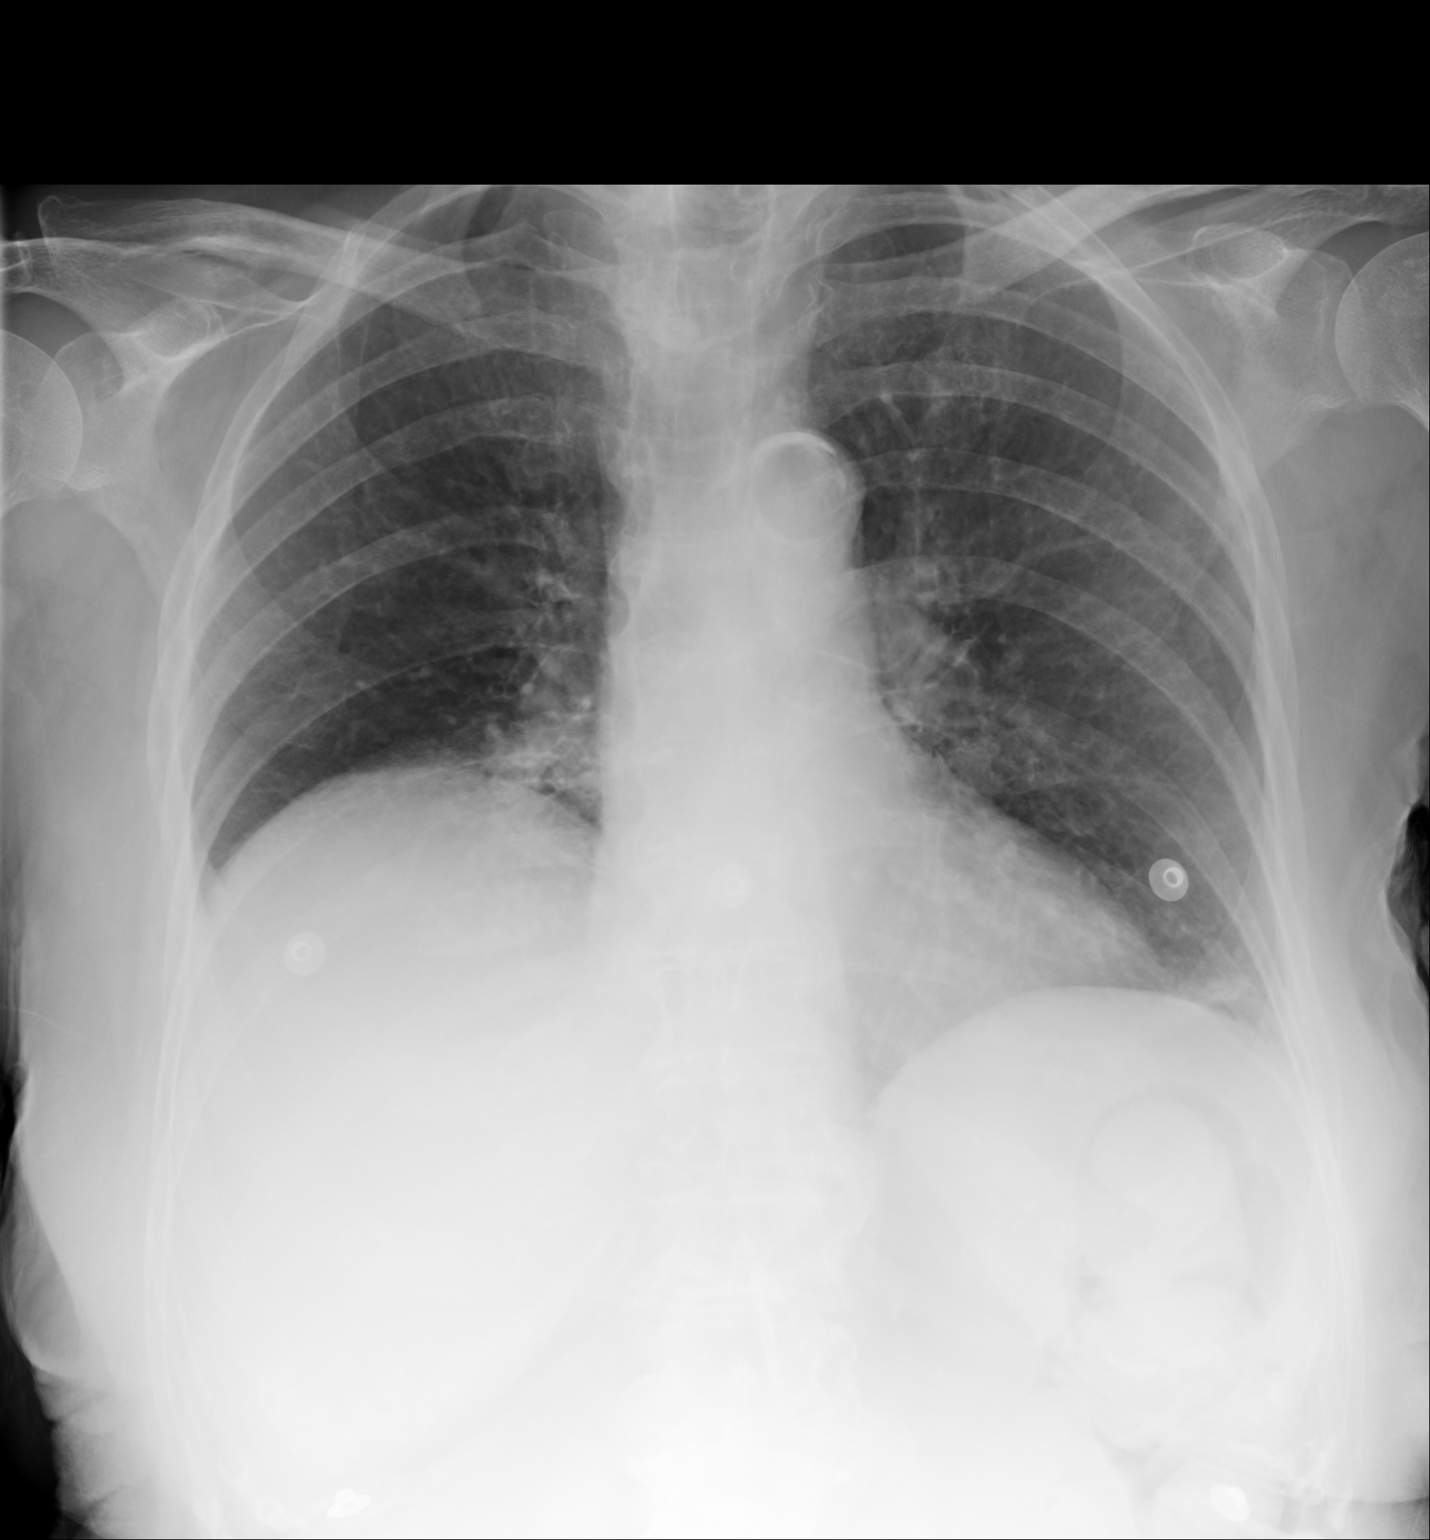

[1 of 1 positions shown; findings below may reference images not displayed]

PROCEDURE:     DXR - DXR PORTABLE CHEST SINGLE VIEW  - June 12, 2012  [DATE]

RESULT:     Comparison is made to study 27 May, 2012.

There is some minimal density at the left costophrenic angle and the right
lung base paramediastinal region. Minimal atelectasis or fibrosis is
suspected. Infiltrate is felt to be less likely. Cardiac silhouette is
normal. There is stable elevation of the right hemidiaphragm.
Atherosclerotic calcification is present within the aortic arch.
IMPRESSION: Patchy areas of density in both lung bases suggestive of
minimal atelectasis, fibrosis or infiltrate.

[REDACTED]

## 2013-06-21 ENCOUNTER — Ambulatory Visit: Payer: Self-pay | Admitting: Oncology

## 2013-06-25 LAB — CBC CANCER CENTER
BASOS PCT: 0.6 %
Basophil #: 0 x10 3/mm (ref 0.0–0.1)
Eosinophil #: 0.2 x10 3/mm (ref 0.0–0.7)
Eosinophil %: 3.3 %
HCT: 38.1 % (ref 35.0–47.0)
HGB: 12.6 g/dL (ref 12.0–16.0)
Lymphocyte #: 1.8 x10 3/mm (ref 1.0–3.6)
Lymphocyte %: 24.5 %
MCH: 29.8 pg (ref 26.0–34.0)
MCHC: 32.9 g/dL (ref 32.0–36.0)
MCV: 91 fL (ref 80–100)
MONOS PCT: 10.9 %
Monocyte #: 0.8 x10 3/mm (ref 0.2–0.9)
Neutrophil #: 4.4 x10 3/mm (ref 1.4–6.5)
Neutrophil %: 60.7 %
Platelet: 165 x10 3/mm (ref 150–440)
RBC: 4.21 10*6/uL (ref 3.80–5.20)
RDW: 14.2 % (ref 11.5–14.5)
WBC: 7.2 x10 3/mm (ref 3.6–11.0)

## 2013-06-26 LAB — CEA: CEA: 1.9 ng/mL (ref 0.0–4.7)

## 2013-07-01 ENCOUNTER — Ambulatory Visit: Payer: Self-pay | Admitting: Oncology

## 2013-09-04 IMAGING — CR DG CHEST 1V PORT
1 series · 1 of 1 positions shown · non-contrast
Comparison: none

REASON FOR EXAM: Chest Pain
COMMENTS:

PROCEDURE:     DXR - DXR PORTABLE CHEST SINGLE VIEW  - September 17, 2012  [DATE]
RESULT:     Comparison made to prior study of 06/22/2012. Atelectasis versus
scarring again noted right lung base. Elevation right hemidiaphragm. Left
lung is clear. Heart size is normal.

[ap]
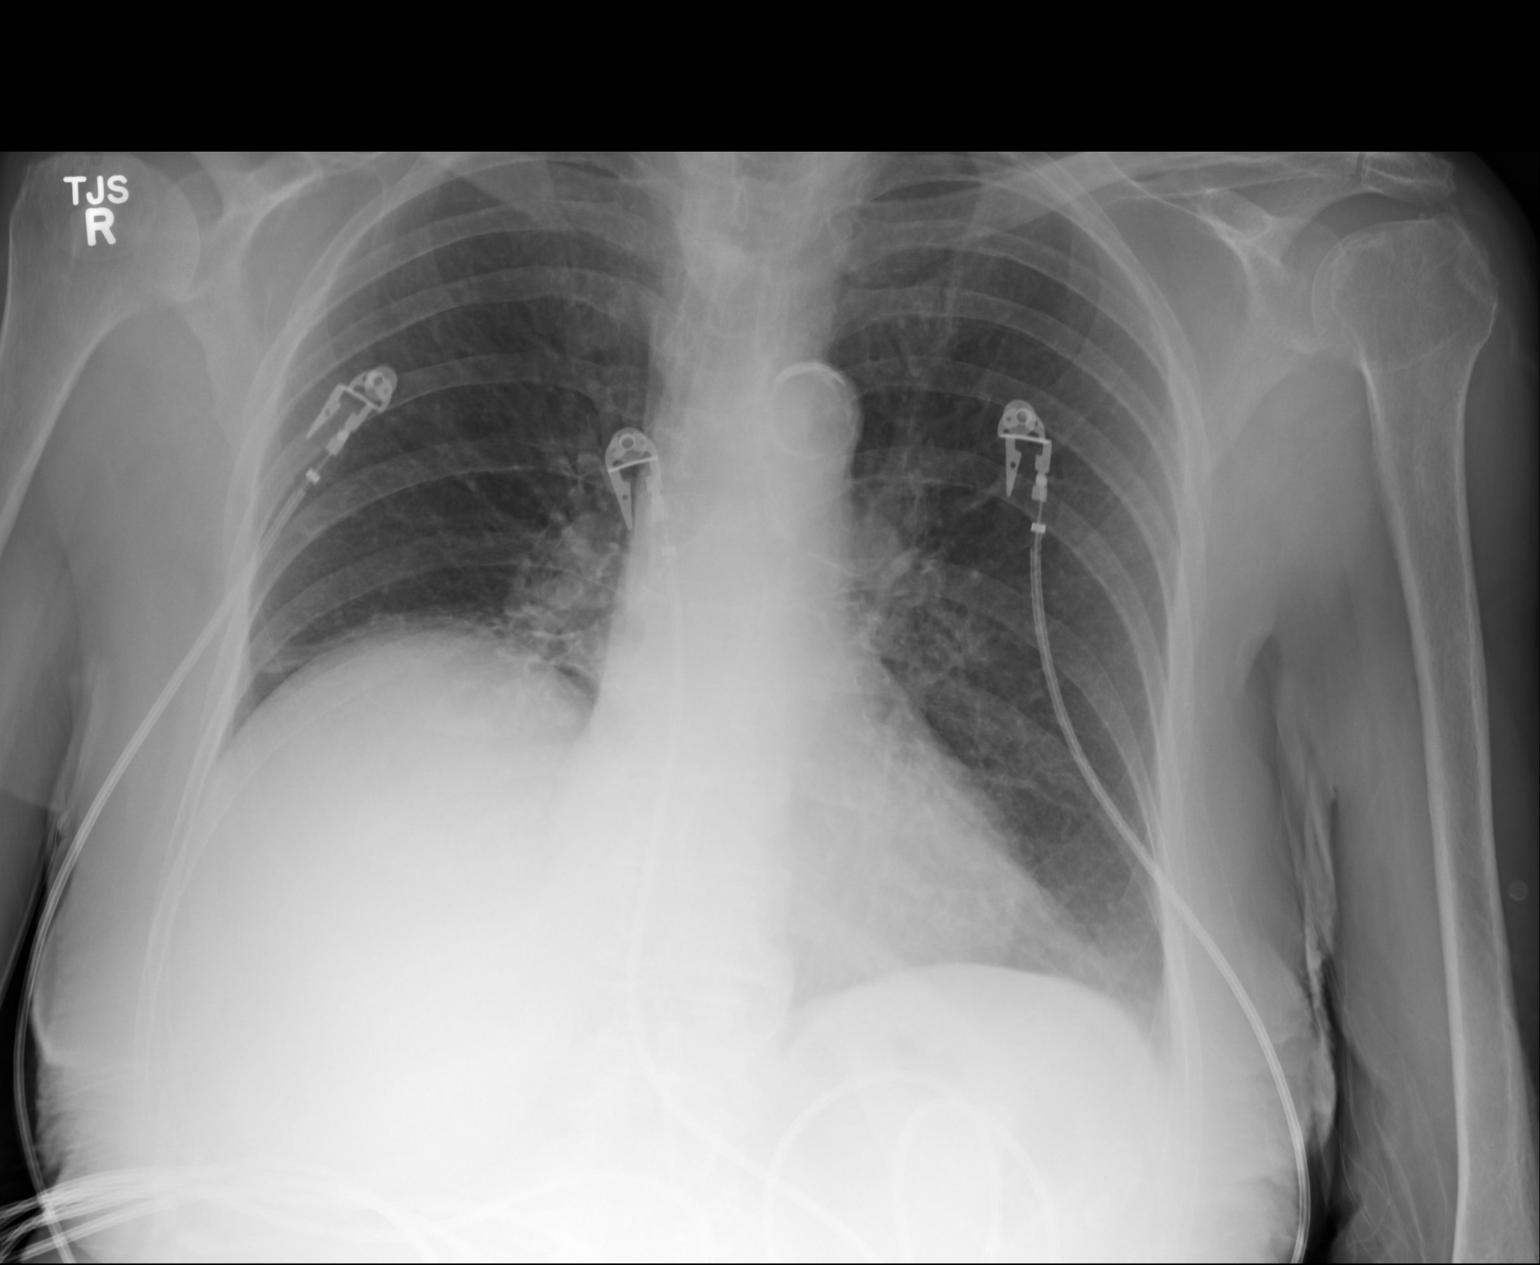

[1 of 1 positions shown; findings below may reference images not displayed]

IMPRESSION: Atelectasis versus scarring right lung base. Elevation
right hemidiaphragm. No acute abnormality.

## 2014-05-23 NOTE — Consult Note (Signed)
Brief Consult Note: Diagnosis: DM, colon cancer, HTN.   Patient was seen by consultant.   Consult note dictated.   Orders entered.   Comments: 89y/oF who is very hard of hearing, HTN, Mild dementia, DM admitted for sigmoid colectomy for a partially obstructed sigmoid mass on colonoscopy and biopsy with adenocarc. Medical consult for DM mgmt  * DM- Pt on glipizide at home, however currently NPO last sugars in 200's- will start sliding scale for now if sugars still elevated, will start half dose glipizide or we can wait until she eats will monitor for now  * HTN- cont metoprolol  * Colon cancer- colonoscopy adn diagnosis in June 2014, following at cancer center surgery yesterday and POD #1, outpt f/u with oncology in 3-4 weeks after surgery mgmt per surgical team, currently still NPO  * Leukocytosis- likely from underlying malignancy, surgery, stress cont cipro and flagyl for now  * Iron deficiency anemia- likely from colon cancer adn GI blood loss stable for now.  Electronic Signatures: Gladstone Lighter (MD)  (Signed 10-Jul-14 13:55)  Authored: Brief Consult Note   Last Updated: 10-Jul-14 13:55 by Gladstone Lighter (MD)

## 2014-05-23 NOTE — Discharge Summary (Signed)
PATIENT NAME:  Lauren Lang, Lauren Lang MR#:  [id number] DATE OF BIRTH:  08-Nov-1922  DATE OF ADMISSION:  01/15/2013 DATE OF DISCHARGE:  01/17/2013  DISCHARGE DIAGNOSES:  1.  Stroke.  2.  Atrial fibrillation.  3.  Mild dementia.  4.  Pernicious anemia.   DISCHARGE MEDICATIONS: Per [facility] med reconciliation system. Please see for details. Basically, put on Eloquest b.i.d. Her aspirin was stopped. Pravastatin 20 mg a day was begun.   HISTORY AND PHYSICAL: Please see detailed history and physical done on admission.   HOSPITAL COURSE: The patient was admitted with confusion, hemiparesis and possible aphasia. CT scan was initially negative. Head CT showed multiple small strokes thought to be embolic with her atrial fibrillation. Her cardiologist had not recommend anticoagulation in the past secondary to age and relatively low CHADS score. However, given that her CHADS score was much higher and she was extremely high risk for recurrent strokes; therefore, Eloquest was begun as an outpatient. Initially started on Xarelto as an inpatient, but given the high risk of bleeding was switched to Eloquest. She had minimal carotid stenosis and her echocardiogram was without findings. She had, what appeared to be, a mild CKD somewhat secondary to dehydration as her renal function was normal as an outpatient. She will be seen a week or two in the office. Physical therapy addressed her needs and felt she was fine for home with home health and that will be ordered. She will be discharged pending arrangements today.   TIME SPENT: Please note, it took approximately 36 minutes for discharge.  ____________________________ Ocie Cornfield. Ouida Sills, MD mwa:aw D: 01/17/2013 07:38:59 ET T: 01/17/2013 08:05:58 ET JOB#: [id number]  cc: Ocie Cornfield. Ouida Sills, MD, <Dictator> Kirk Ruths MD ELECTRONICALLY SIGNED 01/18/2013 7:44

## 2014-05-23 NOTE — Discharge Summary (Signed)
PATIENT NAME:  Lauren Lang, ORTNER MR#:  [id number] DATE OF BIRTH:  November 03, 1922  DATE OF ADMISSION:  08/08/2012 DATE OF DISCHARGE:  08/16/2012  DISCHARGE DIAGNOSES: 1.  Sigmoid colon cancer status post laparoscopic assisted sigmoid colectomy. Pathology showing negative nodes, a T3 lesion, 13 nodes counted. 2.   Atrial fibrillation.  3.  Dementia.  4.  Hyperlipidemia.  5.  Diabetes.   DISCHARGE MEDICATIONS: Are as follows:  (Dictation Anomaly) <<MISSING TEXT>> XL 2.5 p.o. daily, metoprolol 50 mg p.o. daily, Norco 1 tab p.o. q. 4 hours p.r.n. pain, Lisinopril 5 mg p.o. daily, metformin 500 mg p.o. b.i.d. with meals.   INDICATION FOR ADMISSION: Ms. Deiter is a pleasant 81 year old female who was initially admitted for diverticulitis. Post resolution cholecystectomy showed a large partially obstructive sigmoid colon mass, which was biopsied as adenocarcinoma. She was thus admitting brought to the operating room suite for laparoscopic sigmoid colectomy.   HOSPITAL COURSE: Ms. Linehan underwent laparoscopic-assisted sigmoid colectomy on July 9th. Throughout hospital course as she regained bowel function, her diet was advanced from n.p.o. to clear liquid and later a regular diet. She also was advanced from IV to p.o. pain medications as she was able to tolerate p.o. On July 13th she did flip into atrial fibrillation with RVR and Cardiology was consulted. This was later controlled with medications. At the time of discharge, she was taking good p.o. with good p.o. pain control and was voiding and stooling without difficulties.   DISCHARGE INSTRUCTIONS: Ms. Petruzzi is to follow up in approximately one week. She is to call or return to the ED if has increased pain, nausea, vomiting, redness or drainage from incision. She will follow up with oncology to determine the need for any further medical interventions.   ____________________________ Glena Norfolk Delshon Blanchfield, MD cal:dp D: 08/25/2012 11:29:50  ET T: 08/25/2012 11:43:01 ET JOB#: [id number]  cc: Harrell Gave A. Mikaela Hilgeman, MD, <Dictator>

## 2014-05-23 NOTE — Discharge Summary (Signed)
PATIENT NAME:  Lauren Lang, ERDMANN MR#:  [id number] DATE OF BIRTH:  1922/05/30  DATE OF ADMISSION:  06/12/2012 DATE OF DISCHARGE:  06/15/2012  DISCHARGE DIAGNOSES:  1.  Sepsis, likely due to diverticulitis and urinary tract infection,  now resolved. 2.  Acute diverticulitis, improving on antibiotics, tolerating diet fine. Pain is much better controlled. 3.  Escherichia coli urinary tract infection, improving on antibiotics.   SECONDARY DIAGNOSES: 1.  Diabetes mellitus. 2.  Hypertension. 3.  Hyperlipidemia.  4.  Depression.   CONSULTATIONS:  1.  Surgery, Dr. Rexene Edison.  2,  Physical therapy.   PROCEDURES AND RADIOLOGY: Chest x-ray on May 13 showed bilateral lung base atelectasis or fibrosis with  some patchy areas.   CT scan of abdomen and pelvis without contrast on May 13 showed focal thickening of distal descending colon at the junction of the sigmoid colon. Adjacent inflammatory changes in the mesenteric fat. Distended urinary bladder. Cholelithiasis. No evidence of obstructive uropathy.   MAJOR LABORATORY PANEL: Urinalysis on admission showed 2 WBCs, 1+ leukocyte esterase,  positive nitrite, trace bacteria.   Blood culture x 2 were negative. Urine culture on May 13 grew more than 100,000 colonies of E. coli.   HISTORY AND SHORT HOSPITAL COURSE: The patient is an 81 year old female with the above-mentioned medical problems who was admitted for sepsis thought to be secondary to diverticulitis and/or UTI. She was started on IV Flagyl and Levaquin. Please see Dr. Graciela Husbands dictated history and physical for further details. Surgical consultation was obtained with Dr. Rexene Edison who recommended continuing antibiotics and monitoring closely. She was improving while on antibiotics. Her diet was slowly advanced and she tolerated fine. Was evaluated by physical therapy and recommended home health with physical therapy. She was doing much better, was close to her baseline, and was discharged home  on May 16 in  stable condition.   PHYSICAL EXAMINATION: VITAL SIGNS: On the date of discharge, her temperature was 97.9, heart rate 77 per minute, respirations 20 per minute, blood pressure 122/72 mmHg. She was saturating 99% on room air.  CARDIOVASCULAR: S1, S2 normal. No murmurs, rubs or gallop.  LUNGS: Clear to auscultation bilaterally. No wheezing, rales, rhonchi or crepitation.  ABDOMEN: Soft, benign.  NEUROLOGIC: Nonfocal examination. All other physical examination remained at baseline.  DISCHARGE MEDICATIONS:  1.  Hydrochlorothiazide/triamterene 50/25  mg 1 tablet p.o. daily. 2.  Metoprolol 100 mg p.o. daily.  3.  Aspirin 81 mg p.o. daily. 4.  Cardizem ER 180 mg p.o. daily. 5.  Glipizide 5 mg p.o. daily. 6.  Levofloxacin 750 mg p.o. daily for 5 days. 7.  Metronidazole 500 mg p.o. every 8 hours for 7 days.   DISCHARGE DIET: Low-sodium, eat light for the first meal, low-residue, avoid nuts.   DISCHARGE ACTIVITY: As tolerated.   DISCHARGE INSTRUCTIONS AND FOLLOWUP:  1.  The patient was set up to get home health physical therapy. Rolling walker prescription was provided.  2.  Follow up with her primary care physician, Dr. Claiborne Billings, in 1 to 2 weeks.  3.  She will need followup with [facility] in 2 to 4 weeks.   TOTAL TIME DISCHARGING THIS PATIENT: 55 minutes.  ____________________________ Lucina Mellow. Manuella Ghazi, MD vss:jm D: 06/15/2012 14:24:56 ET T: 06/15/2012 21:36:53 ET JOB#: [id number]  cc: Miaisabella Bacorn S. Manuella Ghazi, MD, <Dictator> Dr. Claiborne Billings [facility] Harrell Gave A. Rexene Edison, [place] MD ELECTRONICALLY SIGNED 06/15/2012 23:08

## 2014-05-23 NOTE — Consult Note (Signed)
PATIENT NAME:  Lauren Lang, Lauren Lang MR#:  [id number] DATE OF BIRTH:  Jun 05, 1922  DATE OF CONSULTATION:  06/13/2012  CONSULTING PHYSICIAN:  Harrell Gave A. Daegen Berrocal, MD  REASON FOR CONSULTATION: Abdominal pain and fever.   HISTORY OF PRESENT ILLNESS: Lauren Lang is a pleasant 81 year old female with history of diabetes, hypertension and hyperlipidemia, who presented yesterday with left lower quadrant abdominal pain, decreased p.o. and fever. She is confused this morning and is not able to tell me the specific details of the event, but according to her and according to the chart, she developed left lower quadrant pain and had decreased p.o. intake. She had a fever of 102.1 and leukocytosis of 12.9. She had a CT of abdomen and pelvis with IV contrast that showed diverticulitis. She was started on antibiotics. She is currently without pain. Has not had any chest pain, shortness of breath, cough, headache, nausea, vomiting, diarrhea, constipation, bright red blood per rectum. No dysuria or hematuria.   PAST MEDICAL HISTORY:  1. Diabetes mellitus.  2. Hypertension.  3. Hyperlipidemia.  4. Depression.   SOCIAL HISTORY: Lives at home with her daughter. Denies alcohol or drug use or tobacco use.   FAMILY HISTORY: Cancer in multiple family members, including liver cancer and breast cancer.   ALLERGIES: PENICILLIN.   HOME MEDICATIONS:  1. Aspirin 81 p.o. daily. 2. Diltiazem 100 mg p.o. daily.  3. Glipizide 5 mg p.o. daily.  4. Metoprolol 100 mg p.o. daily.  5. Hydrochlorothiazide/triamterene 50/75 p.o. daily.   REVIEW OF SYSTEMS: A 12-point review of systems was obtained. Pertinent positives and negatives as above.   PHYSICAL EXAMINATION: VITAL SIGNS: Temperature 98.3, pulse 76, blood pressure 105/61, respirations 20, 97% on room air.  GENERAL: No acute distress, appears confused, but eventually does get to the correct answer when pressed with questions.  HEAD: Normocephalic, atraumatic.  EYES:  No scleral icterus. No conjunctivitis.  LUNGS: Clear to auscultation, moving air well.  HEART: Regular rate and rhythm. No murmurs, rubs or gallops.  ABDOMEN: Soft, completely nontender, nondistended.  EXTREMITIES: Moves all extremities well. Strength 5 out of 5.  NEUROLOGIC: Cranial nerves II through XII grossly intact. Sensation intact in all 4 extremities.   LABORATORY DATA: Admitted with a white cell count of 12.9, currently 7.6, with 59% neutrophils. LFTs are normal at admission. Creatinine is 0.99.   IMAGING: Shows a slightly thickened descending to sigmoid colon with a minimal amount of adjacent fat stranding.   ASSESSMENT AND PLAN: Lauren Lang is a pleasant 81 year old female with left lower quadrant pain which has resolved, leukocytosis which has resolved, and mild findings of diverticulitis. Would continue to treat as diverticulitis. Okay with advancing diet and transitioning to p.o. antibiotics. Would need colonoscopy as an outpatient; however, would have to evaluate with GI to determine if she is able to undergo this at her age. Would transition to p.o. antibiotics. Will follow with you.   ____________________________ Glena Norfolk. Jhana Giarratano, MD cal:OSi D: 06/13/2012 07:43:10 ET T: 06/13/2012 08:13:32 ET JOB#: [id number]  cc: Harrell Gave A. Cora Stetson, MD, <Dictator> Floyde Parkins MD ELECTRONICALLY SIGNED 06/13/2012 15:43

## 2014-05-23 NOTE — Discharge Summary (Signed)
PATIENT NAME:  Lauren Lang, Lauren Lang MR#:  [id number] DATE OF BIRTH:  04/21/1922  DATE OF ADMISSION:  06/22/2012 DATE OF DISCHARGE:  06/25/2012  HISTORY AND PHYSICAL:  For a detailed note, please take a look at the history and physical done on admission by Dr. Margaretmary Eddy.   DISCHARGE DIAGNOSES: As follows:  1.  Altered mental status secondary to acute hyponatremia. 2.  Acute hyponatremia secondary to concomitant use of diuretics and dehydration.  3.  Hypertension.  4.  Diabetes.  5.  History of diverticulitis.   DIET:  The patient is being discharged on a low-sodium, low-fat, carb-controlled diet.   ACTIVITY: As tolerated.   FOLLOWUP: With Dr. Claiborne Billings in the next 1 to 2 weeks.   DISCHARGE MEDICATIONS:  Aspirin 81 mg daily, glipizide 5 mg daily, metronidazole 500 mg q. 8 hours, Toprol 50 mg daily, and Levaquin 250 mg every 48 hours.   CONSULTANTS DURING HOSPITAL COURSE: Dr. Murlean Iba from Nephrology.   PERTINENT LABORATORY AND RADIOLOGICAL DATA DURING THE HOSPITAL COURSE: As follows: A CT scan of the head done without contrast on admission showing no acute intracranial abnormality. A chest x-ray done on admission showing no acute cardiopulmonary disease of the chest. Sodium level on admission noted to be as low as 114.   HOSPITAL COURSE: This is an 81 year old female with medical problems as mentioned above, presented to the hospital on 06/22/2012 secondary to altered mental status, confusion and noted to have seizure-type activity.   1.  Altered mental status and encephalopathy:  Most likely cause of this was related to acute hyponatremia as the patient was noted to have a sodium as low as 114. She was given some IV fluid hydration the Emergency Room, although her sodium did not improve. She was, therefore, admitted to the intensive care unit and started on 3% saline. Her sodium improved fairly quickly with 3% saline which was quickly discontinued. As her sodium improved, her mental status  also improved, and she is currently back to a baseline. Her CT head did not show any evidence of acute neurologic source.  2.  Acute hyponatremia:  This is likely acute hypovolemic hypotonic hyponatremia from dehydration and concomitant use of diuretics. Her triamterene/ HCTZ were held in the hospital. She was initially given 3% saline, although that was quickly stopped, and then she was placed on just normal saline which was also shortly thereafter discontinued.  Nephrology consult was obtained to help manage her hyponatremia. The patient's sodium has now completely normalized. At this point, I am taking her off high-dose triamterene/HCTZ so she does not develop this again at this point.  3.  Hypertension:  The patient actually remained somewhat hypotensive and normotensive while in the hospital.  Most of her antihypertensives were held.  At this point, I am just discharging her on low-dose Toprol. She was on triamterene/HCTZ and Cardizem, both of which are being currently discontinued.  4.  Diabetes: The patient was initially placed on sliding scale insulin, although she will resume her glipizide upon discharge.  5.  History of recent diverticulitis:  The patient had no abdominal pain, nausea, vomiting or fever. She apparently was on Levaquin and Flagyl prior to coming in. This was continued while in the hospital. She can finish her therapy as stated by her doctor as an outpatient.   CODE STATUS: The patient is a FULL CODE.   TIME SPENT WITH DISCHARGE: 40 minutes.   ____________________________ Belia Heman. Verdell Carmine, MD vjs:cb D: 06/25/2012 15:10:13 ET T: 06/25/2012  15:23:40 ET JOB#: [id number]  cc: Belia Heman. Verdell Carmine, MD, <Dictator> Claiborne Billings, MD Henreitta Leber MD ELECTRONICALLY SIGNED 07/08/2012 20:27

## 2014-05-23 NOTE — Consult Note (Signed)
PATIENT NAME:  Lauren Lang, Lauren Lang MR#:  [id number] DATE OF BIRTH:  1922-11-28  DATE OF CONSULTATION:  08/09/2012  CONSULTING PHYSICIAN:  Gladstone Lighter, MD  ADMITTING PHYSICIAN:  Marlyce Huge, MD  REASON FOR CONSULTATION: Diabetes management.   PRIMARY CARE PHYSICIAN: Currently none.   PRIMARY ONCOLOGIST:   Dr. Grayland Ormond.  BRIEF HISTORY: Kellie Simmering is an 81 year old elderly Caucasian female with past medical history significant for diabetes, hypertension, hyperlipidemia, mild dementia, very hard of hearing, who had couple of hospitalizations in the last three months for diverticulitis, had an outpatient colonoscopy done in June 2014 by Dr. Lucilla Lame which showed a fungating, partially obstructing sigmoid mass. Biopsies from that mass confirmed colonic adenocarcinoma moderately differentiated. The patient has seen Dr. Grayland Ormond in the office at the end of June and agree to proceed partial colectomy and further treatment plan after that. The patient has been admitted for elective partial colectomy on 08/08/2012 and had the procedure done. Postoperatively, she is still n.p.o., has abdominal pain, her glipizide has been on hold and her sugars have been in the 200 range, so medical consultation has been requested at this time. The patient is very hard of hearing. Nephew at bedside. Most of the history and also history obtained from old records. She denies any complaints other than abdominal pain at this time.    PAST MEDICAL HISTORY: 1. Early dementia.   2.  Paroxysmal atrial fibrillation.  3.  Hyperlipidemia.  4.  Non-insulin-dependent diabetes mellitus.   PAST SURGICAL HISTORY: Sigmoid colectomy done this admission.   ALLERGIES: Penicillin.  CURRENT HOME MEDICATIONS:   1.  Metoprolol XL 50 mg p.o. daily.  2.  Glipizide 5 mg p.o. daily.   SOCIAL HISTORY: The patient lives at home with her daughter. No history of any smoking or alcohol use.   FAMILY HISTORY: Significant for cancers  in the family. Both her sisters passed away from cancer.   REVIEW OF SYSTEMS:  CONSTITUTIONAL: No fever, fatigue or weakness.  EYES: No blurred vision, double vision or glaucoma.  ENT: No tinnitus or ear pain. Positive for significant hearing loss in both ears. No dysphagia, epistaxis or discharge problems.  RESPIRATORY: No cough, wheeze, hemoptysis or chronic obstructive pulmonary disease.  CARDIOVASCULAR: No chest pain, orthopnea, edema, arrhythmia, palpitations or syncope.  GASTROINTESTINAL: Positive for nausea. No vomiting, no diarrhea. Positive for constipation. Positive for abdominal pain from her surgery. No hematemesis or melena.  GENITOURINARY: No dysuria, hematuria, renal calculus, frequency or incontinence.  ENDOCRINE: No polyuria, nocturia, thyroid problems, heat or cold intolerance.  HEMATOLOGY: Positive for anemia. No easy bruising or bleeding.  SKIN: No acne, rash or lesions.  MUSCULOSKELETAL: No neck, back, shoulder pain, arthritis or gout.  NEUROLOGIC: No CVA, transient ischemic attack, seizures or ataxia.  PSYCHOLOGIC: No anxiety, insomnia or depression.   PHYSICAL EXAMINATION: VITAL SIGNS: Temperature 98.8 degrees Fahrenheit, pulse 73, respirations 18, blood pressure 115/58, pulse oximetry 99% on 2 liters.  GENERAL: Well-built, well-nourished female sitting in bed, not in any acute distress.  HEENT: Normocephalic, atraumatic. Pupils equal, round, reacting to light. Anicteric sclerae. Extraocular movements intact. Oropharynx clear without erythema, mass or exudates.  NECK: Supple. No thyromegaly, JVD or carotid bruits. No lymphadenopathy.  LUNGS: Moving air bilaterally. No wheeze or crackles. No use of accessory muscles for breathing.  CARDIOVASCULAR: S1, S2 regular rate and rhythm, 3/6 systolic murmur in the precordial region. No rubs or gallops.   ABDOMEN: Mildly distended but appears soft and has good bowel sounds today. She has midline  dressing placed and is tender  around that area. No  guarding or rebound tenderness.  EXTREMITIES: No pedal edema. No clubbing or cyanosis. 2+ dorsalis pedis pulses palpable bilaterally.  SKIN: No acne, rash or lesions.   LYMPHATICS: No cervical lymphadenopathy.   NEUROLOGIC: Cranial nerves are intact. No focal motor or sensory deficits. Strength is 5/5 all four extremities.  PSYCHOLOGIC:  The patient is alert, oriented x 3.   LAB DATA: WBC 13.9, hemoglobin 9.7, hematocrit 28.8, platelet count 193.   Sodium 137, potassium 3.5, chloride 102, bicarbonate 23, BUN 8, creatinine 1.14, glucose 232, calcium of 8.5. HbA1c is slightly elevated at 6.5.  AST is 19, ALT 17, alkaline phosphatase 92, total bilirubin 0.6 and albumin of 3.6.   RECOMMENDATIONS: A an 81 year old female with past medical history significant for hypertension, diabetes on oral medications, early dementia and very hard of hearing with a partially obstructing sigmoid mass, who was admitted for elected partial sigmoid colectomy secondary to adenocarcinoma. Medical consult requested for diabetes management.  1.  Non-insulin-dependent diabetes mellitus. HbA1c is 6.5. The patient on glipizide at home; however, since currently she is nothing oral, we will hold off on restarting the glipizide, rather we will put her on sliding scale insulin for her sugars in the 200 range. Hopefully if she is started oral diet, then we will restart her glipizide at that time, continue to monitor for now.  2.  Hypertension. Continue her metoprolol.  3.  Colon cancer.  The patient had her diagnosis done based on colonoscopy and biopsy at the end of June 2014. She had one visit with Dr. Grayland Ormond at the [facility] and supposed to follow up with him again within 3 to 4 weeks after partial colectomy to discuss any adjuvant chemotherapy. Currently she is nothing oral per the postop management by surgery. Today is postoperative day 1.  4.  Leukocytosis, likely from underlying and stress from  surgery. Continue Cipro and Flagyl for now.  5.  Iron deficiency anemia, likely from her colon cancer and chronic gastrointestinal blood loss, appears is stable for now. Continue to monitor.  6.  Gastrointestinal and deep vein thrombosis prophylaxis. The patient is on IV Protonix and subcutaneous heparin respectively .   CODE STATUS: Full code.   TIME SPENT ON CONSULTATION: 50 minutes   ____________________________ Gladstone Lighter, MD rk:cc D: 08/09/2012 14:54:08 ET T: 08/09/2012 16:15:12 ET JOB#: [id number]  cc: Gladstone Lighter, MD, <Dictator> Kathlene November. Grayland Ormond, MD Gladstone Lighter MD ELECTRONICALLY SIGNED 08/27/2012 18:37

## 2014-05-23 NOTE — H&P (Signed)
PATIENT NAME:  Lauren Lang, Lauren Lang MR#:  [id number] DATE OF BIRTH:  1922/08/15  DATE OF ADMISSION:  06/12/2012  REFERRING PHYSICIAN: Dr. Valli Glance. Owens Shark  PRIMARY CARE PHYSICIAN: Dr. Barnabas Harries   CHIEF COMPLAINT: Abdominal pain, fever.   HISTORY OF PRESENT ILLNESS: This is an 81 year old female with significant past medical history of diabetes, hypertension, hyperlipidemia who presents with complaints of abdominal pain, decreased p.o. intake and fever. The patient reports she developed these symptoms yesterday, initially started having abdominal pain in the left lower quadrant. Then as well, she had decreased p.o. intake, mainly decreased fluid intake. As well, she had a fever. The patient was febrile in the ED with a temperature of 102.1 and she had significant leukocytosis of 12.9. The patient had CT of abdomen and pelvis with IV contrast which did show evidence of diverticulitis. The patient had blood cultures sent and she was started on IV Levaquin and Flagyl. The patient denies any chest pain, any shortness of breath, any coffee-ground emesis, any nausea or vomiting, any melena or bright red blood per rectum. By the time of my exam, she already received IV morphine and her abdominal pain had resolved. Hospitalist Service was requested to admit the patient for further management and treatment of her diverticulitis.   PAST MEDICAL HISTORY:  1. Diabetes mellitus.  2. Hypertension.  3. Hyperlipidemia.  4. Depression.   PAST SURGICAL HISTORY: None.   SOCIAL HISTORY: Lives at home with her daughter. No history of smoking, alcohol abuse or illicit drug use.   FAMILY HISTORY: Significant for cancer in multiple family members. One of her sisters had liver cancer, another one had breast cancer.   ALLERGIES: PENICILLIN.   HOME MEDICATIONS:  1. Aspirin 81 mg oral daily.  2. Diltiazem extended release 180 mg oral daily.  3. Glipizide 5 mg oral daily.  4. Metoprolol succinate 100 mg oral daily.   5. She had recently her statin and nortriptyline stopped by her primary care physician.  6. Hydrochlorothiazide/triamterene 5/75 mg oral daily.   REVIEW OF SYSTEMS:  CONSTITUTIONAL: The patient complains of fever, fatigue, weakness. Denies weight gain, weight loss.  EYES: Denies blurry vision, double vision, pain, inflammation.  ENT: Denies tinnitus, ear pain, epistaxis, discharge. Is extremely hard of hearing, wearing hearing aids.  RESPIRATORY: Denies cough, wheezing, hemoptysis, dyspnea, COPD. CARDIOVASCULAR: Denies chest pain, edema, arrhythmia, palpitations, syncope.  GASTROINTESTINAL: Denies nausea, vomiting, diarrhea, hematemesis, coffee-ground emesis, melena, jaundice. Has complaints of abdominal pain.  GENITOURINARY: He has some complaints of mild dysuria. Denies hematuria or renal colic.  ENDOCRINE: Denies polyuria, polydipsia, heat or cold intolerance.  HEMATOLOGY: Denies anemia, easy bruising, bleeding diathesis.  INTEGUMENTARY: Denies acne, rash or skin lesions.  MUSCULOSKELETAL: Denies any gout, redness, limited activity, arthritis.  NEUROLOGIC: Denies CVA, TIA, ataxia, vertigo, dysarthria, epilepsy.  PSYCHIATRIC: Denies anxiety, schizophrenia, nervousness, substance abuse, alcohol abuse. Has history of depression.   PHYSICAL EXAMINATION:  VITAL SIGNS: Temperature 102.1, pulse 96, respiratory rate 20, blood pressure 119/58, saturating 97% on room air.  GENERAL: Elderly female, looks comfortable in bed in no apparent distress.  HEENT: Head atraumatic, normocephalic. Pupils equal, reactive to light. Pink conjunctivae. Anicteric sclerae. No nasal congestion. Has hearing aid in the right ear. Dry oral mucosa.  NECK: Supple. No thyromegaly. No JVD.  CHEST: Good air entry bilaterally. No wheezing, rales, rhonchi.  CARDIOVASCULAR: S1, S2 heard. No rubs, murmur or gallops.  ABDOMEN: Soft, nontender, nondistended. Bowel sounds present.  EXTREMITIES: No edema. No clubbing. No  cyanosis.  PSYCHIATRIC: Appropriate affect. Awake, alert x 3. Intact judgment and insight.  NEUROLOGIC: Extremely hard of hearing. Otherwise, cranial nerves grossly intact. Motor 5/5. No focal deficits.  SKIN: Delayed skin turgor. Warm and dry.   PERTINENT LABORATORY DATA: Glucose 204, BUN 24, creatinine 1.21, sodium 131, potassium 3.7, chloride 98, CO2 of 24. Total protein 7.2, ALT 31, AST 30, alkaline phosphatase 108. White blood cells 12.9, hemoglobin 9.2, hematocrit 28.3, platelets 271. Urine analysis: Positive nitrites with +1 leukocyte esterase and 2 white blood cells, bacteria trace.   CT abdomen and pelvis with IV contrast showing cholelithiasis, no evidence of obstructive uropathy and focal thickening at the junction of the descending and sigmoid colon with fat stranding, consideration include colonic neoplasm or diverticulitis.   ASSESSMENT AND PLAN:  1. Sepsis. The patient is febrile 102.1 with significant leukocytosis, had septic workup done. So far, appears to be having diverticulitis and urinary tract infection.  2. Acute diverticulitis. The patient will be kept on clear liquid diet and aggressive IV fluid hydration. Will be started on IV Levaquin and Flagyl and will consult surgical service. 3. Urinary tract infection. The patient is on Levaquin.  4. Hypertension. Will hold most of her meds. Will continue her only on metoprolol and if her blood pressure uncontrolled in the future, will add other medications.  5. Diabetes mellitus. Hold oral agent and start her on insulin sliding scale.  6. Deep vein thrombosis prophylaxis. Subcu heparin.  7. Gastrointestinal prophylaxis. On Protonix.  8. Code status. Discussed with the patient and family and they want her to be FULL CODE, but in case she becomes  dependent on life support, they do not wish to continue with that.   Total time spent on admission and patient care: 55 minutes.    ____________________________ Albertine Patricia,  MD dse:es D: 06/12/2012 06:58:10 ET T: 06/12/2012 08:29:36 ET JOB#: [id number]  cc: Albertine Patricia, MD, <Dictator> Wright Gravely Graciela Husbands MD ELECTRONICALLY SIGNED 06/13/2012 7:53

## 2014-05-23 NOTE — Consult Note (Signed)
   Present Illness 81 yo female with history of paroxysmal afib, history of hypertension and diabetes with recent diagnosis of sigmoid mass. She is s/p laproscopic sigmoid colectomy. She is POD 3. She was noted to have a rapid heart rate on exam and ekg revealed afib with rapid ventriuclar response with ventricular rate of 150-170. She was relatively hypotensive. She recieved an iv dose of digoxin and 5 mg of iv metoprolol with no improvement. She was transferred to the ccu where she was started on a cardizem drip at 5 mg/hr. She is currently in afib with rapid vr with rates of 120-130. She deneis chest pain. She is a limited historian and does not report recent problems with rapid heart rate . SHe denies chest pain.   Physical Exam:  HEENT hearing intact to voice   NECK supple   RESP normal resp effort   CARD Irregular rate and rhythm  Tachycardic   ABD hypoactive BS  incisional discomfort   LYMPH negative neck   EXTR negative edema   SKIN normal to palpation   NEURO cranial nerves intact, motor/sensory function intact   PSYCH A+O to time, place, person   Review of Systems:  General: Weakness   Skin: No Complaints   ENT: No Complaints   Eyes: No Complaints   Neck: No Complaints   Respiratory: No Complaints   Cardiovascular: No Complaints   Gastrointestinal: No Complaints   Genitourinary: No Complaints   Vascular: No Complaints   Musculoskeletal: No Complaints   Neurologic: No Complaints   Hematologic: No Complaints   Endocrine: No Complaints   Psychiatric: No Complaints   Review of Systems: All other systems were reviewed and found to be negative   Medications/Allergies Reviewed Medications/Allergies reviewed     Dementia:    Atrial Fibrillation:    Hyperlipidemia:    Diabetes:    Denies surgical history.:   Home Medications: Medication Instructions Status  metoprolol succinate 50 mg oral tablet, extended release 1 tab(s) orally once a day (in  the morning) Active  GlipiZIDE XL 2.5 mg oral tablet, extended release 1 tab(s) orally once a day Active   EKG:  Interpretation afib with rvr    Penicillin: Hives   Impression 81 yo female with history of paroxysmal afib s/p lap sigmoid colectomy with post op course complicated by afib with rvr. She is hemodynaically stable on current meds including iv cardizem at 5 mg/hr. She is also on po metoprolol and recieved a dose of iv digoxin. Rate is improved. K is 3.2 and mg 1.2. Will repleat K and Mg. Follow rate   Plan 1. Contorl rate with iv cardizem, metoprolol po 2. Defer high dose anticoagulation at present given recent surgery 3. Echo to evaluate lv funciton and left atrium 4. Further recs pending course   Electronic Signatures: Teodoro Spray (MD)  (Signed 13-Jul-14 15:15)  Authored: General Aspect/Present Illness, History and Physical Exam, Review of System, Past Medical History, Home Medications, EKG , Allergies, Impression/Plan   Last Updated: 13-Jul-14 15:15 by Teodoro Spray (MD)

## 2014-05-23 NOTE — H&P (Signed)
PATIENT NAME:  Lauren Lang, Lauren Lang MR#:  [id number] DATE OF BIRTH:  02/04/1922  DATE OF ADMISSION:  06/22/2012  PRIMARY CARE PHYSICIAN: Garth Schlatter, MD  REFERRING PHYSICIAN: Valli Glance. Owens Shark, MD  CHIEF COMPLAINT: Altered mental status and seizurelike activity.   HISTORY OF PRESENT ILLNESS: The patient is an 81 year old Caucasian female with past medical history of diabetes mellitus, hypertension, hyperlipidemia and depression, who was recently admitted to the hospital on May 13th with sepsis from diverticulitis and UTI and was discharged on May 16th. The patient was discharged to home with Flagyl and levofloxacin. She was doing okay until yesterday, and she was seen by her primary care physician, Dr. Georgina Snell, and at that time, she was stable, and he had recommended her to follow up with GI today regarding her diverticulitis as the patient was still having abdominal pain. According to the patient's daughter, who is at bedside, the patient was nauseous yesterday and had 3 to 4 episodes of vomiting. Yesterday evening, the patient became confused, and eventually at around 2:30 a.m. today morning, her body became stiff, and she had seizurelike activity. According to the daughter, it lasted for 2 seconds approximately, but did not notice any tongue biting or urinary incontinence. The patient was brought into the ER, and her sodium is at 114. CT of the head has revealed no acute findings. The patient was given 1 liter fluid bolus by the EMS and another 500 mL fluid bolus in the ER by the ER physician and has received a total of 1.5 liters of normal saline. During my examination, the patient is opening her eyes to verbal commands but not answering any questions. Daughter and granddaughter are at bedside. According to them, before her current and the previous admission to the hospital on May 13th, she was quite independent and taking care of herself, though she is 81 years old. No similar complaints in the past.  She has been taking hydrochlorothiazide regarding her blood pressure for several years, as reported by the family members. The patient's potassium is at 3.0. CAT scan of the head revealed no acute abnormality.   PAST MEDICAL HISTORY:  1. Diabetes mellitus.  2. Hypertension.  3. Hyperlipidemia.  4. Depression. 5. Recent history of diverticulitis and sepsis from UTI, regarding which the patient was admitted to hospital and discharged home with p.o. Levaquin and Flagyl on May 16th.   PAST SURGICAL HISTORY: None.   ALLERGIES: PENICILLIN.   HOME MEDICATIONS:  1. Hydrochlorothiazide/triamterene 50/25 one tablet p.o. once daily.  2. Metoprolol 100 mg once daily.  3. Aspirin 81 mg once daily. 4. Cardizem ER 180 mg once daily.  5. Glipizide 5 mg once daily.  6. Levofloxacin 750 mg p.o. daily for 5 day. 7. Metronidazole 500 mg q.8 hours for 7 days.   PSYCHOSOCIAL HISTORY: Lives at home with daughter. No history of smoking, alcohol or illicit drug usage.   FAMILY HISTORY: Cancer runs in her family. Sister had liver cancer. Another sister had breast cancer.   REVIEW OF SYSTEMS: CONSTITUTIONAL: Unobtainable as the patient is with altered mental status. According to the patient's daughter, the patient was complaining of generalized abdominal pain for the past 2 days.    PHYSICAL EXAMINATION:  VITAL SIGNS: Temperature 97.5, pulse 54 to 74, respirations 18, blood pressure 109/63, pulse oximetry 97% on 2 liters.  GENERAL APPEARANCE: Not in acute distress. Moderately built and moderately nourished.  HEENT: Normocephalic, atraumatic. Pupils are equally reacting to light and accommodation. No scleral icterus. No  conjunctival injection. No sinus tenderness. Dry mucous membranes.  NECK: Supple. No JVD. No thyromegaly. No lymphadenopathy.  LUNGS: Clear to auscultation bilaterally. No accessory muscle usage. No anterior chest wall tenderness on palpation.  CARDIAC: S1 and S2 normal. Regular rate and  rhythm. Positive murmur.  GASTROINTESTINAL: Soft. Bowel sounds are positive in all 4 quadrants. Nontender during my examination. No rebound tenderness. No masses felt. No hepatosplenomegaly.  NEUROLOGIC: The patient is lethargic. Opens her eyes spontaneously to verbal commands, but not answering any questions. Is not following verbal commands, so I could not examine cranial nerves. Reflexes are 2+. Motor and sensory could not be examined as the patient is not following verbal commands.  EXTREMITIES: No edema. No cyanosis. No clubbing.  SKIN: Warm to touch. Dry in nature. No rashes. No lesions.  MUSCULOSKELETAL: No joint effusion, tenderness or erythema.   LABORATORY DATA: Accu-Chek 311. Glucose 138, sodium 114, which was normal at 137 on May 16th, potassium 3.0, chloride 80, CO2 23, anion gap is 11, GFR greater than 60, serum osmolality 233, calcium 8.9, BUN 12, creatinine 0.77. LFTs are within normal range. First set of cardiac enzymes is normal with a troponin less than 0.02. WBC is at 13.6, hemoglobin is 9.7, which was at 8.5 on May 16th, hematocrit 39.2, platelets 332. PT 13.4, INR 1.0. Urinalysis: Straw-colored, clear in appearance. Glucose, bilirubin and ketones are negative. Nitrite and leukocyte esterase are negative. No bacteria seen. A 12-lead EKG has revealed normal sinus rhythm at 65 beats per minute with premature ventricular complexes. PR interval is prolonged at 202 ms with first-degree AV block, left axis deviation. Nonspecific ST-T wave changes. Prolonged QT. CT head without contrast has revealed no acute hemorrhage. No acute abnormality of the brain. MRI of the brain may be obtained if clinical concern for acute intracranial abnormality persists. No other contraindications. Repeat STAT serum sodium is pending at this time.   ASSESSMENT AND PLAN: An 81 year old Caucasian female brought into the ER for altered mental status and seizurelike activity at 2:30 a.m. today and with 1-day history  of nausea, vomiting and abdominal pain. Will be admitted with the following assessment and plan.   1. Altered mental status with seizurelike activity, most probably from acute severe hyponatremia, which could be from dehydration secondary to nausea and vomiting, and other etiology needs to be ruled out. The patient is on hydrochlorothiazide chronically which could also cause hyponatremia, but ironically, the patient's sodium and potassium were normal on May 16th during the recent admission. Repeat STAT serum sodium is at 113, and will start the patient on hypertonic saline at 30 mL per hour and repeat sodium at 9:00 a.m. STAT nephrology consult is placed and notified to Dr. Candiss Norse, who is aware. Will also obtain renal ultrasound.  Neuro checks will be obtained as per CCU protocol. Will also check TSH and fasting lipid panel. Random urine sodium, chloride and urine osmolality were ordered.  The goal is not to correct more than 8 mEq of sodium in 24 hours, which might cause overzealous correction and lead to central pontine myelonecrosis. Will monitor the patient's BMP very closely.  2. Hypokalemia. Will replace with potassium chloride 40 mEq IV as the patient is going to be n.p.o. 3. Recent history of diverticulitis. As the patient is still complaining of abdominal pain, will continue levofloxacin and Flagyl in the IV form.  4. Diabetes mellitus. The patient will be on sliding scale as she is going to be n.p.o.  5. Chronic atrial fibrillation,  currently rate controlled.   6. Will provide her gastrointestinal and deep vein thrombosis prophylaxis.   CODE STATUS: She is full code. The patient's daughter is her medical POA.   TOTAL CRITICAL CARE TIME SPENT: 60 minutes.   The diagnosis and plan of care were discussed in detail with the patient's daughter and granddaughter at bedside. They both verbalized understanding of the plan.    ____________________________ Nicholes Mango, MD ag:OSi D: 06/22/2012  07:01:27 ET T: 06/22/2012 07:38:20 ET JOB#: [id number]  cc: Nicholes Mango, MD, <Dictator> Garth Schlatter, MD Murlean Iba, MD Nicholes Mango MD ELECTRONICALLY SIGNED 06/30/2012 6:56

## 2014-05-23 NOTE — Op Note (Signed)
PATIENT NAME:  Lauren Lang, Lauren Lang MR#:  [id number] DATE OF BIRTH:  [birth year]  DATE OF PROCEDURE:  08/09/2012  PREOPERATIVE DIAGNOSIS: Sigmoid colon cancer.  POSTOPERATIVE DIAGNOSIS: Sigmoid colon cancer.  PROCEDURE PERFORMED: 1.  Laparoscopic sigmoid colectomy. 2.  Laparoscopic splenic flexure mobilization.   SURGEON: Marlyce Huge, MD.  ESTIMATED BLOOD LOSS: 50 mL.   COMPLICATIONS: None.   SPECIMEN: Large bowel containing colon mass.   ANESTHESIA: General.   INDICATION FOR SURGERY: Lauren Lang is a pleasant 81 year old female, who has presented previously with diverticulitis. She underwent a colonoscopy and was found to have a moderately differentiated adenocarcinoma which likely was masquerading as diverticulitis. She thus presented for colon resection.   DETAILS OF PROCEDURE: Informed consent was obtained. Lauren Lang was brought to the operating room suite. She was laid supine on the operating room table. She was induced. Endotracheal tube was placed, general anesthesia was administered. Her abdomen was then prepped and draped in a standard surgical fashion. A timeout was then performed correctly identifying the patient name, operative site and procedure to be performed. An infraumbilical incision was made. This was deepened down to the fascia. The fascia was incised. The peritoneum was entered. There were no obvious adhesions. Two stay sutures were placed through the fascia. Hassan trocar was placed in the abdomen. The abdomen was insufflated. A 10 mm 30 degree scope was then placed through the trocar. A suprapubic 5 mm trocar was placed  and a right lower quadrant 5 mm trocar was placed. The mass was then visualized. It appeared to be scarred into the abdominal sidewall above the iliac. I then mobilized the right colon and splenic flexure and attempted laparoscopically to release the sidewall. As she had presented with diverticulitis, I was concerned that this may have been a  contained perforation, which looked similar to diverticulitis. I thus, after mobilizing the entire left colon and splenic flexure, made an incision inferior to the umbilicus and proceeded to perform the surgery open. I then mobilized the left colon from the sidewall. The left iliac and ureter were identified. Once I was happy that I had 5 cm proximal and distal to the mass, I transected the colon using a GIA 75 stapler. I then divided the mesentery as high as possible using a combination of Harmonic scalpel and Kelly clamps and suture ties. I then took out the specimen. I then decided I had enough length for side-to-side functional end-to-end anastomosis. I considered using an EEA to perform end-to-end anastomosis; however, the left colon required a larger incision as the left colon was still somewhat tethered. I then made an enterotomy in the proximal and distal section and placed both limbs of the stapler into the enterotomies to perform a side-to-side anastomosis. The stapler was fired. I then closed the colotomies with a TX 60 stapler. I then oversewed the colotomy closure with 3-0 silk Lembert sutures. I then examined the dissection site and the area where the cancer appeared to be adherent to the pelvic sidewall. I did place 2 medium clips. I then returned to the abdomen. I irrigated the abdomen. We then changed the drapes so that they were sterile. I closed the midline incision using a running looped #1 PDS and tied the 2 sutures of the loop at the ends of the suture line. I then used staples to close the skin and closed the previous port sites using a stapler as well. I then placed a sterile dressing over the wound. The patient was then awoken, extubated  and brought to the postanesthesia care unit. There were no immediate complications. Needle, sponge, and instrument counts were correct at the end of the procedure.   ____________________________ Lauren Lang. Lauren Tapscott, MD cal:aw D: 08/09/2012 07:17:10  ET T: 08/09/2012 07:29:43 ET JOB#: [id number]  cc: Harrell Gave A. Cecile Guevara, MD, <Dictator> Floyde Parkins MD ELECTRONICALLY SIGNED 08/16/2012 17:59

## 2014-05-23 NOTE — Discharge Summary (Signed)
PATIENT NAME:  Lauren Lang, Lauren Lang MR#:  [id number] DATE OF BIRTH:  [birth year]  DATE OF ADMISSION:  08/08/2012 DATE OF DISCHARGE:  08/16/2012  DISCHARGE DIAGNOSES: 1.  Sigmoid colon cancer status post laparoscopic assisted sigmoid colectomy. Pathology showing negative nodes, a T3 lesion, 13 nodes counted. 2.   Atrial fibrillation.  3.  Dementia.  4.  Hyperlipidemia.  5.  Diabetes.   DISCHARGE MEDICATIONS: Are as follows:  Glipizide XL 2.5 p.o. daily, metoprolol 50 mg p.o. daily, Norco 1 tab p.o. q. 4 hours p.r.n. pain, Lisinopril 5 mg p.o. daily, metformin 500 mg p.o. b.i.d. with meals.   INDICATION FOR ADMISSION: [name] is a pleasant 81 year old female who was initially admitted for diverticulitis. Post resolution cholecystectomy showed a large partially obstructive sigmoid colon mass, which was biopsied as adenocarcinoma. She was thus admitting brought to the operating room suite for laparoscopic sigmoid colectomy.   HOSPITAL COURSE: [name] underwent laparoscopic-assisted sigmoid colectomy on July 9th. Throughout hospital course as she regained bowel function, her diet was advanced from n.p.o. to clear liquid and later a regular diet. She also was advanced from IV to p.o. pain medications as she was able to tolerate p.o. On July 13th she did flip into atrial fibrillation with RVR and Cardiology was consulted. This was later controlled with medications. At the time of discharge, she was taking good p.o. with good p.o. pain control and was voiding and stooling without difficulties.   DISCHARGE INSTRUCTIONS: [name] is to follow up in approximately one week. She is to call or return to the ED if has increased pain, nausea, vomiting, redness or drainage from incision. She will follow up with oncology to determine the need for any further medical interventions.   ____________________________ Glena Norfolk Elene Downum, MD cal:dp D: 08/25/2012 11:29:00 ET T: 08/25/2012 11:43:01  ET JOB#: [id number]  cc: Harrell Gave A. Kamrin Sibley, MD, <Dictator> Floyde Parkins MD ELECTRONICALLY SIGNED 09/03/2012 19:03

## 2014-05-24 NOTE — H&P (Signed)
PATIENT NAME:  Lauren Lang, Lauren Lang MR#:  [id number] DATE OF BIRTH:  May 05, 1922  DATE OF ADMISSION:  01/14/2013  PRIMARY CARE PHYSICIAN: Dr. Frazier Richards  REFERRING PHYSICIAN: Dr. Joni Fears.   CHIEF COMPLAINT:  Right-sided weakness.    HISTORY OF PRESENT ILLNESS: Ms. Ruben is an 81 year old pleasant white female who lives with her daughter. She has history of atrial fibrillation, hypertension, hyperlipidemia, diabetes mellitus, is brought to the Emergency Department after having a sudden onset of right-sided weakness, slurred speech and confusion. The patient standing, all of a sudden was leaning towards the right side. This was associated with confusion slurred speech. Concerning this, EMS was called and by time of  EMS, the patient had some improvement with the mental status,  continued to have right-sided weakness. By the time the patient came to the Emergency Department, all the symptoms were resolved. Unable to obtain any history from the patient as the patient is confused.  At baseline, the patient is well oriented, is able to cook at home without any assistance. The patient is not able to provide any history. Work-up in the Emergency Department: CT head shows moderate atrophy; however no other acute abnormality.   CBC and complete metabolic panel are completely within normal limits.   PAST MEDICAL HISTORY:  1.  Depression.  2.  Dementia.  3.  Atrial fibrillation.  4.  Hyperlipidemia.  5.  Diabetes mellitus, on oral medication.  6.  Previous history of colon cancer.   PAST SURGICAL HISTORY:  Hemicolectomy for colon CA.   ALLERGIES: PENICILLIN.   HOME MEDICATIONS: 1.  Vitamin D3 400 units daily.  2.  Vitamin B12 500 units once a day.  3.  Vitamin B12 once a month.  4.  Metoprolol 50 mg once a day.  5.  Lisinopril 5 mg daily.  6.  Glipizide 5 mg once a day.  7.  Centrum Silver once a day.  8.  Aspirin 81 mg daily.   SOCIAL HISTORY: No history of smoking, drinking alcohol or  using illicit drugs. Lives with her daughter.   FAMILY HISTORY: Cancer in the family; sister had liver cancer and another sister with breast cancer.   REVIEW OF SYSTEMS: Could not be obtained from the patient as the patient has altered mental status.   PHYSICAL EXAMINATION: GENERAL: This is a well-built, well-nourished, age-appropriate female lying down in the bed, not in distress.  VITAL SIGNS: Temperature 97.7, pulse 66, blood pressure 154/92, respiratory rate of 16, oxygen saturation 98% on room air.  HEENT: Head normocephalic, no sclerae icterus. Conjunctivae normal. Pupils equal and react to light. Extraocular movements are intact. Mucous membranes moist. No pharyngeal erythema.  NECK:  Supple, no lymphadenopathy. No JVD. No carotid bruit. No thyromegaly.  CHEST: Has no focal tenderness.  LUNGS: Bilaterally clear to auscultation no pedal edema. Pulses 2+.  ABDOMEN: Bowel sounds present. Soft, nontender, nondistended. No hepatosplenomegaly.  SKIN: No rash or lesions.  MUSCULOSKELETAL: Good range of motion in all the extremities.  NEUROLOGIC: The patient is oriented to person, but  not to place, and place, time and situation. Motor 5/5 in upper and lower extremities. No pronator drift.    LABORATORY, DIAGNOSTIC AND RADIOLOGIC DATA:  Complete metabolic panel: BUN 28, creatinine of 1.19. The rest of all the values are within normal limits. CBC is completely within normal limits.   Troponin 0.04.   CT head without contrast: Moderate atrophy, with previous , ventricular small vessel disease, no demonstrable intracranial mass, hemorrhage.  ASSESSMENT AND PLAN: Ms. Zale is an 81 year old female who comes to the Emergency Department with sudden onset of right-sided weakness, confusion.  1.  Altered mental status. Concerned about cerebrovascular accident.  CT head without contrast does not show any acute intracranial abnormality. We will obtain MRI of the brain,  echocardiogram and carotid  Doppler. We will keep the patient on aspirin. Obtain lipid profile.  2.  Hypertension, currently well controlled.  3.  Atrial fibrillation. Currently rate is well controlled. The patient is not on any rate controlling medication.  The patient is with CHADS Score of 3.  4.  Diabetes mellitus. Continue the glipizide. On sliding scale insulin.  5.  Keep the patient on deep vein thrombosis prophylaxis with Lovenox.   TIME SPENT: 50 minutes   ____________________________ Monica Becton, MD pv:cc D: 01/15/2013 01:31:54 ET T: 01/15/2013 02:05:26 ET JOB#: [id number]  cc: Monica Becton, MD, <Dictator> Ocie Cornfield. Ouida Sills, MD Grier Mitts Lajada Janes MD ELECTRONICALLY SIGNED 02/08/2013 21:07

## 2017-03-21 ENCOUNTER — Encounter: Payer: Self-pay | Admitting: Internal Medicine

## 2017-03-21 ENCOUNTER — Emergency Department
Admission: EM | Admit: 2017-03-21 | Discharge: 2017-03-22 | Disposition: A | Discharge status: Other Acute Inpatient Hospital | Payer: Medicare Other | Attending: Emergency Medicine | Admitting: Emergency Medicine

## 2017-03-21 ENCOUNTER — Emergency Department: Payer: Medicare Other

## 2017-03-21 ENCOUNTER — Other Ambulatory Visit: Payer: Self-pay

## 2017-03-21 DIAGNOSIS — R579 Shock, unspecified: Secondary | ICD-10-CM | POA: Insufficient documentation

## 2017-03-21 DIAGNOSIS — Y999 Unspecified external cause status: Secondary | ICD-10-CM | POA: Insufficient documentation

## 2017-03-21 DIAGNOSIS — F0390 Unspecified dementia without behavioral disturbance: Secondary | ICD-10-CM | POA: Insufficient documentation

## 2017-03-21 DIAGNOSIS — S098XXA Other specified injuries of head, initial encounter: Secondary | ICD-10-CM | POA: Diagnosis not present

## 2017-03-21 DIAGNOSIS — Y9389 Activity, other specified: Secondary | ICD-10-CM | POA: Insufficient documentation

## 2017-03-21 DIAGNOSIS — A419 Sepsis, unspecified organism: Secondary | ICD-10-CM | POA: Insufficient documentation

## 2017-03-21 DIAGNOSIS — S79811A Other specified injuries of right hip, initial encounter: Secondary | ICD-10-CM | POA: Diagnosis present

## 2017-03-21 DIAGNOSIS — Z7901 Long term (current) use of anticoagulants: Secondary | ICD-10-CM | POA: Diagnosis not present

## 2017-03-21 DIAGNOSIS — W0110XA Fall on same level from slipping, tripping and stumbling with subsequent striking against unspecified object, initial encounter: Secondary | ICD-10-CM | POA: Insufficient documentation

## 2017-03-21 DIAGNOSIS — N189 Chronic kidney disease, unspecified: Secondary | ICD-10-CM | POA: Insufficient documentation

## 2017-03-21 DIAGNOSIS — I129 Hypertensive chronic kidney disease with stage 1 through stage 4 chronic kidney disease, or unspecified chronic kidney disease: Secondary | ICD-10-CM | POA: Insufficient documentation

## 2017-03-21 DIAGNOSIS — E1122 Type 2 diabetes mellitus with diabetic chronic kidney disease: Secondary | ICD-10-CM | POA: Diagnosis not present

## 2017-03-21 DIAGNOSIS — Z85038 Personal history of other malignant neoplasm of large intestine: Secondary | ICD-10-CM | POA: Insufficient documentation

## 2017-03-21 DIAGNOSIS — Z79899 Other long term (current) drug therapy: Secondary | ICD-10-CM | POA: Diagnosis not present

## 2017-03-21 DIAGNOSIS — S32111A Minimally displaced Zone I fracture of sacrum, initial encounter for closed fracture: Secondary | ICD-10-CM | POA: Insufficient documentation

## 2017-03-21 DIAGNOSIS — S329XXA Fracture of unspecified parts of lumbosacral spine and pelvis, initial encounter for closed fracture: Secondary | ICD-10-CM | POA: Diagnosis not present

## 2017-03-21 DIAGNOSIS — Y92009 Unspecified place in unspecified non-institutional (private) residence as the place of occurrence of the external cause: Secondary | ICD-10-CM | POA: Diagnosis not present

## 2017-03-21 DIAGNOSIS — S36039A Unspecified laceration of spleen, initial encounter: Secondary | ICD-10-CM | POA: Insufficient documentation

## 2017-03-21 HISTORY — DX: Panic disorder (episodic paroxysmal anxiety): F41.0

## 2017-03-21 HISTORY — DX: Malignant (primary) neoplasm, unspecified: C80.1

## 2017-03-21 HISTORY — DX: Disorder of kidney and ureter, unspecified: N28.9

## 2017-03-21 HISTORY — DX: Unspecified dementia, unspecified severity, without behavioral disturbance, psychotic disturbance, mood disturbance, and anxiety: F03.90

## 2017-03-21 HISTORY — DX: Type 2 diabetes mellitus without complications: E11.9

## 2017-03-21 HISTORY — DX: Major depressive disorder, single episode, unspecified: F32.9

## 2017-03-21 HISTORY — DX: Unspecified atrial fibrillation: I48.91

## 2017-03-21 HISTORY — DX: Depression, unspecified: F32.A

## 2017-03-21 HISTORY — DX: Essential (primary) hypertension: I10

## 2017-03-21 NOTE — ED Provider Notes (Signed)
[name] Emergency Department Provider Note  ____________________________________________   First MD Initiated Contact with Patient 03/21/17 2314     (approximate)  I have reviewed the triage vital signs and the nursing notes.   HISTORY  Chief Complaint Fall  Level 5 exemption history limited by the patient's dementia  HPI Lauren Lang is a 84 y.o. female who comes to the emergency department by EMS after a fall at home at around 7 PM roughly 4 hours prior to arrival.  According to her grandson at bedside the patient got up to walk to go to the bathroom and fell onto her right side striking her head.  She has had difficulty ambulating ever since reporting significant discomfort in her right hip.  Family held off on calling 911 until around 11 PM at which point the patient had a seizure-like activity.  When EMS arrived they noted that her initial blood pressure was 70 and her initial heart rate was 40.  Past Medical History:  Diagnosis Date  . Atrial fibrillation ([place])   . Cancer [facility])    Colon (previously)  . Dementia   . Depression   . Diabetes mellitus without complication ([place])   . Hypertension   . Panic attacks   . Renal disorder    CKD    There are no active problems to display for this patient.   Past Surgical History:  Procedure Laterality Date  . COLON RESECTION      Prior to Admission medications   Medication Sig Start Date End Date Taking? Authorizing Provider  metoprolol succinate (TOPROL-XL) 50 MG 24 hr tablet Take 1 tablet by mouth daily. 07/25/12  Yes [provider]  sertraline (ZOLOFT) 50 MG tablet Take 50 mg by mouth daily. 09/06/12  Yes [provider]  escitalopram (LEXAPRO) 10 MG tablet Take 5 mg by mouth daily.    [provider]  warfarin (COUMADIN) 1 MG tablet Take 1 tablet by mouth daily. 03/17/17   [provider]  warfarin (COUMADIN) 3 MG tablet Take 1 tablet by mouth daily.  02/08/17   [provider]    Allergies Penicillins  History reviewed. No pertinent family history.  Social History Social History   Tobacco Use  . Smoking status: Never Smoker  . Smokeless tobacco: Never Used  Substance Use Topics  . Alcohol use: No    Frequency: Never  . Drug use: No    Review of Systems Level 5 exemption history limited by the patient's dementia  ____________________________________________   PHYSICAL EXAM:  VITAL SIGNS: ED Triage Vitals  Enc Vitals Group     BP      Pulse      Resp      Temp      Temp src      SpO2      Weight      Height      Head Circumference      Peak Flow      Pain Score      Pain Loc      Pain Edu?      Excl. in [place]?    Constitutional: No acute distress.  Does appear uncomfortable.  Significant dementia.  No diaphoresis. Eyes: PERRL EOMI. Head: Atraumatic. Nose: No congestion/rhinnorhea. Mouth/Throat: No trismus Neck: No stridor.   Cardiovascular: Bradycardic rate, regular rhythm. Grossly normal heart sounds.  Good peripheral circulation. Respiratory: Normal respiratory effort.  No retractions. Lungs CTAB and moving good air Gastrointestinal: Soft  nondistended mild diffuse tenderness with no rebound or guarding no peritonitis Musculoskeletal: Legs are equal in length.  Exquisite discomfort when ranging right leg Neurologic:   No gross focal neurologic deficits are appreciated. Skin:  Skin is warm, dry and intact. No rash noted. Psychiatric: Severe dementia   ____________________________________________   DIFFERENTIAL includes but not limited to  Mechanical fall, cardiogenic syncope, vasovagal syncope, intracerebral hemorrhage, pelvic fracture, hip fracture, sepsis ____________________________________________   LABS (all labs ordered are listed, but only abnormal results are displayed)  Labs Reviewed  URINALYSIS, COMPLETE (UACMP) WITH MICROSCOPIC - Abnormal; Notable for the following components:        Result Value   Color, Urine YELLOW (*)    APPearance CLOUDY (*)    Leukocytes, UA LARGE (*)    Bacteria, UA RARE (*)    Squamous Epithelial / LPF 0-5 (*)    Non Squamous Epithelial 0-5 (*)    All other components within normal limits  COMPREHENSIVE METABOLIC PANEL - Abnormal; Notable for the following components:   Glucose, Bld 249 (*)    Creatinine, Ser 1.11 (*)    GFR calc non Af Amer 42 (*)    GFR calc Af Amer 48 (*)    All other components within normal limits  CBC WITH DIFFERENTIAL/PLATELET - Abnormal; Notable for the following components:   WBC 17.3 (*)    Neutro Abs 14.1 (*)    Monocytes Absolute 1.4 (*)    All other components within normal limits  LACTIC ACID, PLASMA - Abnormal; Notable for the following components:   Lactic Acid, Venous 4.3 (*)    All other components within normal limits  PROTIME-INR - Abnormal; Notable for the following components:   Prothrombin Time 23.8 (*)    All other components within normal limits  CULTURE, BLOOD (ROUTINE X 2)  CULTURE, BLOOD (ROUTINE X 2)  GASTROINTESTINAL PANEL BY PCR, STOOL (REPLACES STOOL CULTURE)  URINE CULTURE  TROPONIN I  LACTIC ACID, PLASMA    Lab work reviewed by me shows elevated white count which is nonspecific.  Urinalysis is consistent with infection __________________________________________  EKG  ED ECG REPORT I, Darel Hong, the attending physician, personally viewed and interpreted this ECG.  Date: 03/21/2017 EKG Time:  Rate: 60 Rhythm: normal sinus rhythm QRS Axis: Leftward axis Intervals: Slightly prolonged QTC ST/T Wave abnormalities: normal Narrative Interpretation: no evidence of acute ischemia  ____________________________________________  RADIOLOGY  CT chest abdomen pelvis reviewed by me with right-sided pelvic fracture and active extravasation along with grade 2 splenic laceration ____________________________________________   PROCEDURES  Procedure(s) performed:  no  .Critical Care Performed by: Darel Hong, MD Authorized by: Darel Hong, MD   Critical care provider statement:    Critical care time (minutes):  60   Critical care time was exclusive of:  Separately billable procedures and treating other patients   Critical care was necessary to treat or prevent imminent or life-threatening deterioration of the following conditions:  Shock   Critical care was time spent personally by me on the following activities:  Development of treatment plan with patient or surrogate, discussions with consultants, evaluation of patient's response to treatment, examination of patient, obtaining history from patient or surrogate, ordering and performing treatments and interventions, ordering and review of laboratory studies, ordering and review of radiographic studies, pulse oximetry, re-evaluation of patient's condition and review of old charts     Critical Care performed: Yes  Observation: no ____________________________________________   INITIAL IMPRESSION / ASSESSMENT AND PLAN / ED COURSE  Pertinent labs & imaging results that were available during my care of the patient were reviewed by me and considered in my medical decision making (see chart for details).  The patient is hypothermic and hypotensive raising concern for sepsis.  She is diaper dependent and has a history of multiple urinary tract infections.  In and out catheterization pending but I will treat her with a dose of ceftriaxone and fluids now pending results.      ----------------------------------------- 1:53 AM on 03/22/2017 -----------------------------------------  I was called by radiology that the patient has active extravasation in her pelvic fracture as well as a grade 2 splenic laceration.  I then discussed with the family regarding transfer to a trauma hospital and they have specifically requested [facility].  I spoke with [facility] air care inpatient logistics center and they have  accepted the patient as a red tag trauma. ____________________________________________  The patient's blood pressure has maintained and her heart rate has normalized.  1 g of tranexamic acid given.  At this point I do not believe she requires an emergent blood transfusion as I would not want to cause any further bleeding by over transfusing the patient.  Given multiple doses of fentanyl intravenously for pain control.  Had a lengthy discussion with the patient and her family at bedside given her poor prognosis and they have tentatively decided to make her DO NOT RESUSCITATE.  FINAL CLINICAL IMPRESSION(S) / ED DIAGNOSES  Final diagnoses:  Sepsis, due to unspecified organism ([place])  Closed displaced fracture of pelvis, unspecified part of pelvis, initial encounter ([place])  Laceration of spleen, initial encounter  Shock ([place])  Closed minimally displaced zone I fracture of sacrum, initial encounter ([place])      NEW MEDICATIONS STARTED DURING THIS VISIT:  New Prescriptions   No medications on file     Note:  This document was prepared using Dragon voice recognition software and may include unintentional dictation errors.     Darel Hong, MD 03/22/17 2204

## 2017-03-21 NOTE — ED Triage Notes (Addendum)
Pt BIB EMS after a fall at home with loss of consciousness and control of bowels. Family reported possible seizure activity but unsure. EMS reports (R) sided gaze when they arrived and aroused the patient. Per EMS report patient bradycardic on arrival to scene. At this time patient unable to participate in interview.

## 2017-03-22 ENCOUNTER — Emergency Department: Payer: Medicare Other

## 2017-03-22 ENCOUNTER — Ambulatory Visit (HOSPITAL_COMMUNITY)
Admission: AD | Admit: 2017-03-22 | Discharge: 2017-03-22 | Disposition: A | Discharge status: Home / Self Care | Payer: Medicare Other | Source: Other Acute Inpatient Hospital | Attending: Emergency Medicine | Admitting: Emergency Medicine

## 2017-03-22 DIAGNOSIS — S329XXA Fracture of unspecified parts of lumbosacral spine and pelvis, initial encounter for closed fracture: Secondary | ICD-10-CM | POA: Insufficient documentation

## 2017-03-22 DIAGNOSIS — X58XXXA Exposure to other specified factors, initial encounter: Secondary | ICD-10-CM | POA: Diagnosis not present

## 2017-03-22 LAB — CBC WITH DIFFERENTIAL/PLATELET
BASOS ABS: 0 10*3/uL (ref 0–0.1)
BASOS PCT: 0 %
Basophils Absolute: 0 10*3/uL (ref 0–0.1)
Basophils Relative: 0 %
EOS PCT: 1 %
Eosinophils Absolute: 0 10*3/uL (ref 0–0.7)
Eosinophils Absolute: 0.1 10*3/uL (ref 0–0.7)
Eosinophils Relative: 0 %
HCT: 29.6 % — ABNORMAL LOW (ref 35.0–47.0)
HCT: 35.9 % (ref 35.0–47.0)
Hemoglobin: 12 g/dL (ref 12.0–16.0)
Hemoglobin: 9.8 g/dL — ABNORMAL LOW (ref 12.0–16.0)
LYMPHS ABS: 1 10*3/uL (ref 1.0–3.6)
LYMPHS PCT: 6 %
LYMPHS PCT: 9 %
Lymphs Abs: 1.6 10*3/uL (ref 1.0–3.6)
MCH: 30 pg (ref 26.0–34.0)
MCH: 30.6 pg (ref 26.0–34.0)
MCHC: 33 g/dL (ref 32.0–36.0)
MCHC: 33.5 g/dL (ref 32.0–36.0)
MCV: 90.8 fL (ref 80.0–100.0)
MCV: 91.3 fL (ref 80.0–100.0)
MONO ABS: 1.4 10*3/uL — AB (ref 0.2–0.9)
MONOS PCT: 8 %
Monocytes Absolute: 1.7 10*3/uL — ABNORMAL HIGH (ref 0.2–0.9)
Monocytes Relative: 10 %
NEUTROS PCT: 84 %
Neutro Abs: 13.6 10*3/uL — ABNORMAL HIGH (ref 1.4–6.5)
Neutro Abs: 14.1 10*3/uL — ABNORMAL HIGH (ref 1.4–6.5)
Neutrophils Relative %: 82 %
PLATELETS: 161 10*3/uL (ref 150–440)
Platelets: 141 10*3/uL — ABNORMAL LOW (ref 150–440)
RBC: 3.26 MIL/uL — AB (ref 3.80–5.20)
RBC: 3.93 MIL/uL (ref 3.80–5.20)
RDW: 14 % (ref 11.5–14.5)
RDW: 14.3 % (ref 11.5–14.5)
WBC: 16.3 10*3/uL — AB (ref 3.6–11.0)
WBC: 17.3 10*3/uL — ABNORMAL HIGH (ref 3.6–11.0)

## 2017-03-22 LAB — COMPREHENSIVE METABOLIC PANEL
ALT: 18 U/L (ref 14–54)
AST: 37 U/L (ref 15–41)
Albumin: 3.7 g/dL (ref 3.5–5.0)
Alkaline Phosphatase: 63 U/L (ref 38–126)
Anion gap: 11 (ref 5–15)
BILIRUBIN TOTAL: 0.6 mg/dL (ref 0.3–1.2)
BUN: 20 mg/dL (ref 6–20)
CO2: 23 mmol/L (ref 22–32)
CREATININE: 1.11 mg/dL — AB (ref 0.44–1.00)
Calcium: 9.4 mg/dL (ref 8.9–10.3)
Chloride: 103 mmol/L (ref 101–111)
GFR calc Af Amer: 48 mL/min — ABNORMAL LOW (ref >60)
GFR, EST NON AFRICAN AMERICAN: 42 mL/min — AB (ref >60)
GLUCOSE: 249 mg/dL — AB (ref 65–99)
Potassium: 4 mmol/L (ref 3.5–5.1)
Sodium: 137 mmol/L (ref 135–145)
TOTAL PROTEIN: 6.5 g/dL (ref 6.5–8.1)

## 2017-03-22 LAB — URINALYSIS, COMPLETE (UACMP) WITH MICROSCOPIC
Bilirubin Urine: NEGATIVE
GLUCOSE, UA: NEGATIVE mg/dL
Hgb urine dipstick: NEGATIVE
Ketones, ur: NEGATIVE mg/dL
NITRITE: NEGATIVE
Protein, ur: NEGATIVE mg/dL
SPECIFIC GRAVITY, URINE: 1.015 (ref 1.005–1.030)
pH: 6 (ref 5.0–8.0)

## 2017-03-22 LAB — PROTIME-INR
INR: 2.15
PROTHROMBIN TIME: 23.8 s — AB (ref 11.4–15.2)

## 2017-03-22 LAB — LACTIC ACID, PLASMA: Lactic Acid, Venous: 4.3 mmol/L (ref 0.5–1.9)

## 2017-03-22 LAB — TROPONIN I

## 2017-03-22 MED ORDER — LACTATED RINGERS IV SOLN
50.00 | INTRAVENOUS | Status: DC
Start: ? — End: 2017-03-22

## 2017-03-22 MED ORDER — ESCITALOPRAM OXALATE 10 MG PO TABS
10.00 mg | ORAL_TABLET | ORAL | Status: DC
Start: 2017-03-24 — End: 2017-03-22

## 2017-03-22 MED ORDER — TRANEXAMIC ACID 1000 MG/10ML IV SOLN
1000.0000 mg | Freq: Once | INTRAVENOUS | Status: AC
  Administered 2017-03-22: 1000 mg via INTRAVENOUS
  Filled 2017-03-22: qty 10

## 2017-03-22 MED ORDER — ONDANSETRON HCL 4 MG/2ML IJ SOLN
INTRAMUSCULAR | Status: AC
  Administered 2017-03-22: 4 mg via INTRAVENOUS
  Filled 2017-03-22: qty 2

## 2017-03-22 MED ORDER — METOPROLOL TARTRATE 25 MG PO TABS
12.50 mg | ORAL_TABLET | ORAL | Status: DC
Start: 2017-03-24 — End: 2017-03-22

## 2017-03-22 MED ORDER — FENTANYL CITRATE (PF) 100 MCG/2ML IJ SOLN
25.0000 ug | Freq: Once | INTRAMUSCULAR | Status: AC
Start: 2017-03-22 — End: 2017-03-22
  Administered 2017-03-22: 25 ug via INTRAVENOUS
  Filled 2017-03-22: qty 2

## 2017-03-22 MED ORDER — GENERIC EXTERNAL MEDICATION
1.00 | Status: DC
Start: 2017-03-24 — End: 2017-03-22

## 2017-03-22 MED ORDER — FENTANYL CITRATE (PF) 100 MCG/2ML IJ SOLN
25.0000 ug | Freq: Once | INTRAMUSCULAR | Status: AC
  Administered 2017-03-22: 25 ug via INTRAVENOUS
  Filled 2017-03-22: qty 2

## 2017-03-22 MED ORDER — ONDANSETRON HCL 4 MG/2ML IJ SOLN
4.0000 mg | Freq: Once | INTRAMUSCULAR | Status: AC
  Administered 2017-03-22: 4 mg via INTRAVENOUS

## 2017-03-22 MED ORDER — INSULIN REGULAR HUMAN 100 UNIT/ML IJ SOLN
.00 | INTRAMUSCULAR | Status: DC
Start: 2017-03-30 — End: 2017-03-22

## 2017-03-22 MED ORDER — ONDANSETRON HCL 4 MG/2ML IJ SOLN
INTRAMUSCULAR | Status: AC
  Filled 2017-03-22: qty 2

## 2017-03-22 MED ORDER — SODIUM CHLORIDE 0.9 % IV SOLN: 2.0000 g | INTRAVENOUS | Status: DC

## 2017-03-22 MED ORDER — IOPAMIDOL (ISOVUE-300) INJECTION 61%
75.0000 mL | Freq: Once | INTRAVENOUS | Status: AC | PRN
  Administered 2017-03-22: 75 mL via INTRAVENOUS

## 2017-03-22 MED ORDER — CEFTRIAXONE SODIUM 2 G IJ SOLR
2.0000 g | Freq: Once | INTRAMUSCULAR | Status: AC
  Administered 2017-03-22: 2 g via INTRAVENOUS
  Filled 2017-03-22: qty 20

## 2017-03-22 MED ORDER — SODIUM CHLORIDE 0.9 % IV BOLUS (SEPSIS)
500.0000 mL | Freq: Once | INTRAVENOUS | Status: AC
  Administered 2017-03-22: 500 mL via INTRAVENOUS

## 2017-03-22 NOTE — ED Notes (Signed)
EMTALA reviewed by this nurse prior to pt transfer. Appears complete at this time.  

## 2017-03-22 NOTE — ED Notes (Signed)
Patient vomited while traveling to xray. Went to [facility] and gave Zofran there. Patient vomited again while being moved to another position for xray.

## 2017-03-22 NOTE — Progress Notes (Signed)
Pharmacy Antibiotic Note  Lauren Lang is a 84 y.o. female admitted on 03/21/2017 with UTI.  Pharmacy has been consulted for ceftriaxone dosing.  Plan: Ceftriaxone 2 grams q 24 hours ordered.  Height: 5\' 4"  (162.6 cm) Weight: 135 lb 14.4 oz (61.6 kg) IBW/kg (Calculated) : 54.7  Temp (24hrs), Avg:95.8 F (35.4 C), Min:95.4 F (35.2 C), Max:96.2 F (35.7 C)  Recent Labs  Lab 03/21/17 2322 03/21/17 2352  WBC  --  17.3*  CREATININE  --  1.11*  LATICACIDVEN 4.3*  --     Estimated Creatinine Clearance: 27.9 mL/min (A) (by C-G formula based on SCr of 1.11 mg/dL (H)).    Allergies  Allergen Reactions  . Penicillins     Antimicrobials this admission: Ceftriaxone 2/20  >>    Dose adjustments this admission:   Microbiology results: 2/19 BCx: pending 2/20 UCx: pending       2/20 UA: LE(+) NO2(-)  WBC TNTC  Thank you for allowing pharmacy to be a part of this patient's care.  Tamas Suen S 03/22/2017 2:47 AM

## 2017-03-22 NOTE — ED Notes (Addendum)
Unable to give patient more pain medication at this time d/t patient's critical condition and hypotensive state. Patient's family is aware of this.

## 2017-03-22 NOTE — Progress Notes (Signed)
CODE SEPSIS - PHARMACY COMMUNICATION  **Broad Spectrum Antibiotics should be administered within 1 hour of Sepsis diagnosis**  Time Code Sepsis Called/Page Received: 02/20 0006  Antibiotics Ordered: ceftriaxone 02/20 0002  Time of 1st antibiotic administration: 02/20 0107  Additional action taken by pharmacy: 02/20 0100 RN called to advise that abx had been sent. Pt in imaging  If necessary, Name of Provider/Nurse Contacted: ED RN.    Eloise Harman ,PharmD Clinical Pharmacist  03/22/2017  1:04 AM

## 2017-03-22 NOTE — ED Notes (Signed)
Date and time results received: 03/22/17 0139  Test: Lactic Acid  Critical Value: 4.3  Name of Provider Notified: Dr. Mable Paris

## 2017-03-23 MED ORDER — FENTANYL CITRATE (PF) 2500 MCG/50ML IJ SOLN
25.00 | INTRAMUSCULAR | Status: DC
Start: ? — End: 2017-03-23

## 2017-03-24 LAB — URINE CULTURE: Culture: 40000 — AB

## 2017-03-24 MED ORDER — ACETAMINOPHEN 10 MG/ML IV SOLN
1000.00 mg | INTRAVENOUS | Status: DC
Start: 2017-03-24 — End: 2017-03-24

## 2017-03-27 LAB — CULTURE, BLOOD (ROUTINE X 2)
CULTURE: NO GROWTH
CULTURE: NO GROWTH

## 2017-03-27 MED ORDER — MORPHINE SULFATE (CONCENTRATE) 10 MG /0.5 ML PO SOLN
5.00 mg | ORAL | Status: DC
Start: ? — End: 2017-03-27

## 2017-03-27 MED ORDER — NITROGLYCERIN 2 % TD OINT
1.00 | TOPICAL_OINTMENT | TRANSDERMAL | Status: DC
Start: 2017-03-30 — End: 2017-03-27

## 2017-03-27 MED ORDER — HEPARIN (PORCINE) IN NACL 100-0.45 UNIT/ML-% IJ SOLN
12.00 | INTRAMUSCULAR | Status: DC
Start: ? — End: 2017-03-27

## 2017-03-27 MED ORDER — GENERIC EXTERNAL MEDICATION
12.50 mg | Status: DC
Start: ? — End: 2017-03-27

## 2017-03-27 MED ORDER — METOPROLOL TARTRATE 5 MG/5ML IV SOLN
2.50 mg | INTRAVENOUS | Status: DC
Start: 2017-03-27 — End: 2017-03-27

## 2017-03-27 MED ORDER — HEPARIN SODIUM (PORCINE) 1000 UNIT/ML IJ SOLN
2000.00 | INTRAMUSCULAR | Status: DC
Start: ? — End: 2017-03-27

## 2017-03-27 MED ORDER — OLANZAPINE 5 MG PO TBDP
2.50 mg | ORAL_TABLET | ORAL | Status: DC
Start: 2017-03-28 — End: 2017-03-27

## 2017-03-28 NOTE — Progress Notes (Signed)
ED CULTURE REPORT   84 yo presented to the ED on 2/20 with c/o of fall. Blood cultures were obtained. She was then found to have active extravasation in her pelvic fracture. The patient was then transferred to [facility] for further care. Blood cultures resulted on 2/26 showing no growth. Patient also received Ceftriaxone while at [facility] for possible UTI.   Results for orders placed or performed during the hospital encounter of 03/21/17  Culture, blood (routine x 2)     Status: None   Collection Time: 03/21/17 11:22 PM  Result Value Ref Range Status   Specimen Description BLOOD RIGHT [facility]  Final   Special Requests   Final    BOTTLES DRAWN AEROBIC AND ANAEROBIC Blood Culture results may not be optimal due to an excessive volume of blood received in culture bottles   Culture   Final    NO GROWTH 5 DAYS Performed at [facility], [address] [place], [place] [address]    Report Status 03/27/2017 FINAL  Final  Urine culture     Status: Abnormal   Collection Time: 03/22/17 12:10 AM  Result Value Ref Range Status   Specimen Description   Final    URINE, CATHETERIZED Performed at [facility], [address] [place], [place] [address]    Special Requests   Final    NONE Performed at [facility], [address] [place], [place] [address]    Culture 40,000 COLONIES/mL KLEBSIELLA PNEUMONIAE (A)  Final   Report Status 03/24/2017 FINAL  Final   Organism ID, Bacteria KLEBSIELLA PNEUMONIAE (A)  Final      Susceptibility   Klebsiella pneumoniae - MIC*    AMPICILLIN RESISTANT Resistant     CEFAZOLIN <=4 SENSITIVE Sensitive     CEFTRIAXONE <=1 SENSITIVE Sensitive     CIPROFLOXACIN <=0.25 SENSITIVE Sensitive     GENTAMICIN <=1 SENSITIVE Sensitive     IMIPENEM 0.5 SENSITIVE Sensitive     NITROFURANTOIN 128 RESISTANT Resistant     TRIMETH/SULFA <=20 SENSITIVE Sensitive     AMPICILLIN/SULBACTAM <=2 SENSITIVE Sensitive     PIP/TAZO <=4 SENSITIVE Sensitive     Extended  ESBL NEGATIVE Sensitive     * 40,000 COLONIES/mL KLEBSIELLA PNEUMONIAE  Culture, blood (routine x 2)     Status: None   Collection Time: 03/22/17  1:02 AM  Result Value Ref Range Status   Specimen Description BLOOD RIGHT AC  Final   Special Requests   Final    BOTTLES DRAWN AEROBIC AND ANAEROBIC Blood Culture results may not be optimal due to an excessive volume of blood received in culture bottles   Culture   Final    NO GROWTH 5 DAYS Performed at [facility], [address] [place], [place] [address]    Report Status 03/27/2017 FINAL  Final   Lendon Ka, PharmD Pharmacy Resident

## 2017-03-29 MED ORDER — ESCITALOPRAM OXALATE 10 MG PO TABS
10.00 mg | ORAL_TABLET | ORAL | Status: DC
Start: 2017-03-30 — End: 2017-03-29

## 2017-03-29 MED ORDER — POLYETHYLENE GLYCOL 3350 17 G PO PACK
17.00 | PACK | ORAL | Status: DC
Start: 2017-03-30 — End: 2017-03-29

## 2017-03-29 MED ORDER — ACETAMINOPHEN 325 MG PO TABS
650.00 mg | ORAL_TABLET | ORAL | Status: DC
Start: 2017-03-29 — End: 2017-03-29

## 2017-03-29 MED ORDER — METOPROLOL TARTRATE 5 MG/5ML IV SOLN
2.50 mg | INTRAVENOUS | Status: DC
Start: ? — End: 2017-03-29

## 2017-03-29 MED ORDER — OXYCODONE HCL 5 MG PO TABS
5.00 mg | ORAL_TABLET | ORAL | Status: DC
Start: ? — End: 2017-03-29

## 2017-03-29 MED ORDER — GENERIC EXTERNAL MEDICATION
12.50 mg | Status: DC
Start: 2017-03-30 — End: 2017-03-29

## 2017-03-29 MED ORDER — SENNOSIDES 8.6 MG PO TABS
2.00 | ORAL_TABLET | ORAL | Status: DC
Start: 2017-03-29 — End: 2017-03-29

## 2017-03-29 MED ORDER — GENERIC EXTERNAL MEDICATION
1.00 | Status: DC
Start: 2017-03-29 — End: 2017-03-29

## 2017-12-16 ENCOUNTER — Other Ambulatory Visit: Payer: Self-pay

## 2017-12-16 ENCOUNTER — Emergency Department: Payer: Medicare Other

## 2017-12-16 ENCOUNTER — Encounter: Payer: Self-pay | Admitting: Emergency Medicine

## 2017-12-16 ENCOUNTER — Emergency Department
Admission: EM | Admit: 2017-12-16 | Discharge: 2017-12-16 | Disposition: A | Discharge status: Home / Self Care | Payer: Medicare Other | Attending: Emergency Medicine | Admitting: Emergency Medicine

## 2017-12-16 DIAGNOSIS — Z85038 Personal history of other malignant neoplasm of large intestine: Secondary | ICD-10-CM | POA: Insufficient documentation

## 2017-12-16 DIAGNOSIS — Y92129 Unspecified place in nursing home as the place of occurrence of the external cause: Secondary | ICD-10-CM | POA: Diagnosis not present

## 2017-12-16 DIAGNOSIS — F0390 Unspecified dementia without behavioral disturbance: Secondary | ICD-10-CM | POA: Diagnosis not present

## 2017-12-16 DIAGNOSIS — Z7901 Long term (current) use of anticoagulants: Secondary | ICD-10-CM | POA: Insufficient documentation

## 2017-12-16 DIAGNOSIS — Y939 Activity, unspecified: Secondary | ICD-10-CM | POA: Diagnosis not present

## 2017-12-16 DIAGNOSIS — S0101XA Laceration without foreign body of scalp, initial encounter: Secondary | ICD-10-CM | POA: Insufficient documentation

## 2017-12-16 DIAGNOSIS — E119 Type 2 diabetes mellitus without complications: Secondary | ICD-10-CM | POA: Diagnosis not present

## 2017-12-16 DIAGNOSIS — I10 Essential (primary) hypertension: Secondary | ICD-10-CM | POA: Diagnosis not present

## 2017-12-16 DIAGNOSIS — Z79899 Other long term (current) drug therapy: Secondary | ICD-10-CM | POA: Diagnosis not present

## 2017-12-16 DIAGNOSIS — W1830XA Fall on same level, unspecified, initial encounter: Secondary | ICD-10-CM | POA: Diagnosis not present

## 2017-12-16 DIAGNOSIS — Y999 Unspecified external cause status: Secondary | ICD-10-CM | POA: Diagnosis not present

## 2017-12-16 DIAGNOSIS — S0990XA Unspecified injury of head, initial encounter: Secondary | ICD-10-CM | POA: Diagnosis present

## 2017-12-16 DIAGNOSIS — N390 Urinary tract infection, site not specified: Secondary | ICD-10-CM

## 2017-12-16 DIAGNOSIS — W19XXXA Unspecified fall, initial encounter: Secondary | ICD-10-CM

## 2017-12-16 LAB — URINALYSIS, COMPLETE (UACMP) WITH MICROSCOPIC
Bilirubin Urine: NEGATIVE
Glucose, UA: NEGATIVE mg/dL
Hgb urine dipstick: NEGATIVE
Ketones, ur: NEGATIVE mg/dL
Nitrite: POSITIVE — AB
PROTEIN: NEGATIVE mg/dL
Specific Gravity, Urine: 1.015 (ref 1.005–1.030)
pH: 6 (ref 5.0–8.0)

## 2017-12-16 LAB — CBC WITH DIFFERENTIAL/PLATELET
ABS IMMATURE GRANULOCYTES: 0.02 10*3/uL (ref 0.00–0.07)
BASOS ABS: 0.1 10*3/uL (ref 0.0–0.1)
BASOS PCT: 1 %
EOS ABS: 0.3 10*3/uL (ref 0.0–0.5)
Eosinophils Relative: 5 %
HCT: 39.6 % (ref 36.0–46.0)
Hemoglobin: 12.3 g/dL (ref 12.0–15.0)
IMMATURE GRANULOCYTES: 0 %
Lymphocytes Relative: 27 %
Lymphs Abs: 1.4 10*3/uL (ref 0.7–4.0)
MCH: 30 pg (ref 26.0–34.0)
MCHC: 31.1 g/dL (ref 30.0–36.0)
MCV: 96.6 fL (ref 80.0–100.0)
Monocytes Absolute: 0.5 10*3/uL (ref 0.1–1.0)
Monocytes Relative: 10 %
NEUTROS ABS: 3.1 10*3/uL (ref 1.7–7.7)
NRBC: 0 % (ref 0.0–0.2)
Neutrophils Relative %: 57 %
PLATELETS: 150 10*3/uL (ref 150–400)
RBC: 4.1 MIL/uL (ref 3.87–5.11)
RDW: 13.5 % (ref 11.5–15.5)
WBC: 5.4 10*3/uL (ref 4.0–10.5)

## 2017-12-16 LAB — BASIC METABOLIC PANEL
Anion gap: 8 (ref 5–15)
BUN: 33 mg/dL — ABNORMAL HIGH (ref 8–23)
CALCIUM: 9.2 mg/dL (ref 8.9–10.3)
CO2: 23 mmol/L (ref 22–32)
CREATININE: 1.13 mg/dL — AB (ref 0.44–1.00)
Chloride: 110 mmol/L (ref 98–111)
GFR calc non Af Amer: 41 mL/min — ABNORMAL LOW (ref >60)
GFR, EST AFRICAN AMERICAN: 47 mL/min — AB (ref >60)
Glucose, Bld: 185 mg/dL — ABNORMAL HIGH (ref 70–99)
Potassium: 4.5 mmol/L (ref 3.5–5.1)
SODIUM: 141 mmol/L (ref 135–145)

## 2017-12-16 MED ORDER — CEPHALEXIN 500 MG PO CAPS
ORAL_CAPSULE | ORAL | Status: AC
  Filled 2017-12-16: qty 1

## 2017-12-16 MED ORDER — CEPHALEXIN 500 MG PO CAPS
500.0000 mg | ORAL_CAPSULE | Freq: Once | ORAL | Status: AC
  Administered 2017-12-16: 500 mg via ORAL

## 2017-12-16 MED ORDER — CEPHALEXIN 500 MG PO CAPS: 500.0000 mg | ORAL_CAPSULE | Freq: Three times a day (TID) | ORAL | 0 refills | Status: AC

## 2017-12-16 MED ORDER — LIDOCAINE-EPINEPHRINE 2 %-1:100000 IJ SOLN
20.0000 mL | Freq: Once | INTRAMUSCULAR | Status: AC
  Administered 2017-12-16: 20 mL
  Filled 2017-12-16: qty 1

## 2017-12-16 NOTE — ED Provider Notes (Signed)
[facility] Emergency Department Provider Note    ____________________________________________   I have reviewed the triage vital signs and the nursing notes.   HISTORY  Chief Complaint Fall   History limited by and level 5 caveat due to: Dementia, most hx obtained from family   HPI Lauren Lang is a 84 y.o. female who presents to the emergency department today because of concerns for an unwitnessed fall.  Patient is coming from [facility].  Apparently she was found on the floor next to her chair.  Granddaughter who is in the room states that the patient has gotten up out of her chair in the past.  She is unsteady on her feet.  She does state however that sometimes she becomes more agitated or perhaps ask more abnormal when she has a urinary tract infection.  Patient's granddaughter is not aware that the patient has been sick recently.   Per medical record review patient has a history of dementia, atrial fibrillation  Past Medical History:  Diagnosis Date  . Atrial fibrillation ([place])   . Cancer [facility])    Colon (previously)  . Dementia ([place])   . Depression   . Diabetes mellitus without complication ([place])   . Hypertension   . Panic attacks   . Renal disorder    CKD    There are no active problems to display for this patient.   Past Surgical History:  Procedure Laterality Date  . COLON RESECTION      Prior to Admission medications   Medication Sig Start Date End Date Taking? Authorizing Provider  escitalopram (LEXAPRO) 10 MG tablet Take 5 mg by mouth daily.    [provider]  metoprolol succinate (TOPROL-XL) 50 MG 24 hr tablet Take 1 tablet by mouth daily. 07/25/12   [provider]  sertraline (ZOLOFT) 50 MG tablet Take 50 mg by mouth daily. 09/06/12   [provider]  warfarin (COUMADIN) 1 MG tablet Take 1 tablet by mouth daily. 03/17/17   [provider]  warfarin (COUMADIN) 3 MG tablet Take 1 tablet  by mouth daily. 02/08/17   [provider]    Allergies Penicillins; Morphine; Sertraline; and Statins  History reviewed. No pertinent family history.  Social History Social History   Tobacco Use  . Smoking status: Never Smoker  . Smokeless tobacco: Never Used  Substance Use Topics  . Alcohol use: No    Frequency: Never  . Drug use: No    Review of Systems Unable to obtain reliable ROS secondary to dementia ____________________________________________   PHYSICAL EXAM:  VITAL SIGNS: ED Triage Vitals  Enc Vitals Group     BP 12/16/17 1425 123/86     Pulse Rate 12/16/17 1425 70     Resp 12/16/17 1425 18     Temp 12/16/17 1425 (!) 97.4 F (36.3 C)     Temp Source 12/16/17 1425 Axillary     SpO2 12/16/17 1425 100 %     Weight 12/16/17 1426 135 lb 12.9 oz (61.6 kg)     Height 12/16/17 1426 5\' 2"  (1.575 m)   Constitutional: Awake and alert.  Eyes: Conjunctivae are normal.  ENT      Head: Normocephalic. 2 cm laceration to occiput      Nose: No congestion/rhinnorhea.      Mouth/Throat: Mucous membranes are moist.      Neck: No stridor. Hematological/Lymphatic/Immunilogical: No cervical lymphadenopathy. Cardiovascular: Normal rate, regular rhythm.  Systolic II/VI murmur.  Respiratory: Normal respiratory effort  without tachypnea nor retractions. Breath sounds are clear and equal bilaterally. No wheezes/rales/rhonchi. Gastrointestinal: Soft and non tender. No rebound. No guarding.  Genitourinary: Deferred Musculoskeletal: Normal range of motion in all extremities. No lower extremity edema. Neurologic:  Dementia, moving all extremities. Awake and alert.  Skin:  2 cm laceration to occiput  ____________________________________________    LABS (pertinent positives/negatives)  CBC wbc 5.4, hgb 12.3, plt 150 BMP na 141, k 4.5, glu 185, cr 1.13 UA hazy, positive nitrite, small leukocytes, 11-20 wbc, many  bacteria ____________________________________________   EKG  None  ____________________________________________    RADIOLOGY  CT head/cervical spine No acute osseous injury or intracranial bleed. Thyroid nodule.   ____________________________________________   PROCEDURES  Procedures  LACERATION REPAIR Performed by: Nance Pear Authorized by: Nance Pear Consent: Verbal consent obtained. Risks and benefits: risks, benefits and alternatives were discussed Consent given by: patient Patient identity confirmed: provided demographic data Prepped and Draped in normal sterile fashion Wound explored  Laceration Location: occiput  Laceration Length: 2 cm  No Foreign Bodies seen or palpated  Anesthesia: local infiltration  Local anesthetic: lidocaine 1% with epinephrine  Anesthetic total: 2 ml  Irrigation method: syringe Amount of cleaning: standard  Skin closure: staples  Number of staples: 5  Patient tolerance: Patient tolerated the procedure well with no immediate complications.  ____________________________________________   INITIAL IMPRESSION / ASSESSMENT AND PLAN / ED COURSE  Pertinent labs & imaging results that were available during my care of the patient were reviewed by me and considered in my medical decision making (see chart for details).   Patient presented to the emergency department today after a fall.  She did suffer a laceration to her occiput.  CT scans did not show any fracture or intercranial bleed.  Did however show a thyroid nodule which I discussed with the granddaughter.  Blood work without any concerning changes from previous.  Patient's urine is concerning for urinary tract infection.  Will start patient on antibiotics.  The laceration was stapled shut.   ____________________________________________   FINAL CLINICAL IMPRESSION(S) / ED DIAGNOSES  Final diagnoses:  Fall, initial encounter  Lower urinary tract infectious  disease  Laceration of scalp, initial encounter     Note: This dictation was prepared with Diplomatic Services operational officer dictation. Any transcriptional errors that result from this process are unintentional     Nance Pear, MD 12/16/17 [phone]

## 2017-12-16 NOTE — ED Triage Notes (Signed)
Pt arrived via EMS s/p unwitnessed fall. Pt comes from [facility].  Pt was found lying next to her wheelchair on the linoleum floor with hematoma to back of the head.  Pt is wheelchair bound and has hx of dementia. EMS reports they suspect the patient hit her head on the corner of a hand rail near her wheelchair. Pt not currently on any blood thinners. Bleeding present to the back of the head. Pt frequently moaning in room.

## 2017-12-16 NOTE — Discharge Instructions (Addendum)
As we discussed the CT scan did show a thyroid nodule. This can be worked up further by primary care with blood work and ultrasound if so desired. Please seek medical attention for any high fevers, chest pain, shortness of breath, change in behavior, persistent vomiting, bloody stool or any other new or concerning symptoms.

## 2017-12-16 NOTE — ED Notes (Signed)
Family member verbalized discharge instructions and has no questions at this time

## 2018-03-03 DEATH — deceased
